# Patient Record
Sex: Female | Born: 1985 | Race: White | Hispanic: No | Marital: Married | Smoking: Never smoker
Health system: Southern US, Community
[De-identification: ages and names within clinical notes are randomized; demographics above are authoritative.]

## PROBLEM LIST (undated history)

## (undated) DIAGNOSIS — J45909 Unspecified asthma, uncomplicated: Secondary | ICD-10-CM

## (undated) DIAGNOSIS — S82899A Other fracture of unspecified lower leg, initial encounter for closed fracture: Secondary | ICD-10-CM

## (undated) DIAGNOSIS — Z8709 Personal history of other diseases of the respiratory system: Secondary | ICD-10-CM

## (undated) DIAGNOSIS — Z87898 Personal history of other specified conditions: Secondary | ICD-10-CM

## (undated) DIAGNOSIS — F419 Anxiety disorder, unspecified: Secondary | ICD-10-CM

## (undated) DIAGNOSIS — F319 Bipolar disorder, unspecified: Secondary | ICD-10-CM

## (undated) DIAGNOSIS — E669 Obesity, unspecified: Secondary | ICD-10-CM

## (undated) DIAGNOSIS — G43909 Migraine, unspecified, not intractable, without status migrainosus: Secondary | ICD-10-CM

## (undated) HISTORY — PX: BUNIONECTOMY: SHX129

## (undated) HISTORY — PX: ADENOIDECTOMY: SUR15

---
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
## 2008-12-08 ENCOUNTER — Emergency Department (HOSPITAL_COMMUNITY): Admission: EM | Admit: 2008-12-08 | Discharge: 2008-12-08 | Payer: Self-pay | Admitting: Emergency Medicine

## 2009-11-12 ENCOUNTER — Emergency Department (HOSPITAL_COMMUNITY): Admission: EM | Admit: 2009-11-12 | Discharge: 2009-11-12 | Payer: Self-pay | Admitting: Emergency Medicine

## 2010-12-31 LAB — CBC
HCT: 36.6 % (ref 36.0–46.0)
Hemoglobin: 12.4 g/dL (ref 12.0–15.0)
MCHC: 34 g/dL (ref 30.0–36.0)
MCV: 86.5 fL (ref 78.0–100.0)
Platelets: 286 10*3/uL (ref 150–400)
RBC: 4.23 MIL/uL (ref 3.87–5.11)
RDW: 13.6 % (ref 11.5–15.5)
WBC: 10 10*3/uL (ref 4.0–10.5)

## 2010-12-31 LAB — URINALYSIS, ROUTINE W REFLEX MICROSCOPIC
Bilirubin Urine: NEGATIVE
Glucose, UA: NEGATIVE mg/dL
Ketones, ur: NEGATIVE mg/dL
Leukocytes, UA: NEGATIVE
Nitrite: NEGATIVE
Protein, ur: NEGATIVE mg/dL
Specific Gravity, Urine: <1.005 — ABNORMAL LOW (ref 1.005–1.030)
Urobilinogen, UA: 0.2 mg/dL (ref 0.0–1.0)
pH: 5.5 (ref 5.0–8.0)

## 2010-12-31 LAB — COMPREHENSIVE METABOLIC PANEL
ALT: 15 U/L (ref 0–35)
AST: 18 U/L (ref 0–37)
Albumin: 3.5 g/dL (ref 3.5–5.2)
Alkaline Phosphatase: 71 U/L (ref 39–117)
BUN: 12 mg/dL (ref 6–23)
CO2: 26 mEq/L (ref 19–32)
Calcium: 8.5 mg/dL (ref 8.4–10.5)
Chloride: 105 mEq/L (ref 96–112)
Creatinine, Ser: 1.91 mg/dL — ABNORMAL HIGH (ref 0.4–1.2)
GFR calc Af Amer: 39 mL/min — ABNORMAL LOW (ref >60)
GFR calc non Af Amer: 33 mL/min — ABNORMAL LOW (ref >60)
Glucose, Bld: 99 mg/dL (ref 70–99)
Potassium: 3.2 mEq/L — ABNORMAL LOW (ref 3.5–5.1)
Sodium: 138 mEq/L (ref 135–145)
Total Bilirubin: 0.5 mg/dL (ref 0.3–1.2)
Total Protein: 7 g/dL (ref 6.0–8.3)

## 2010-12-31 LAB — PREGNANCY, URINE: Preg Test, Ur: NEGATIVE

## 2010-12-31 LAB — DIFFERENTIAL
Basophils Absolute: 0 10*3/uL (ref 0.0–0.1)
Basophils Relative: 0 % (ref 0–1)
Eosinophils Absolute: 0.1 10*3/uL (ref 0.0–0.7)
Eosinophils Relative: 1 % (ref 0–5)
Lymphocytes Relative: 20 % (ref 12–46)
Lymphs Abs: 2 10*3/uL (ref 0.7–4.0)
Monocytes Absolute: 0.5 10*3/uL (ref 0.1–1.0)
Monocytes Relative: 6 % (ref 3–12)
Neutro Abs: 7.3 10*3/uL (ref 1.7–7.7)
Neutrophils Relative %: 73 % (ref 43–77)

## 2010-12-31 LAB — URINE MICROSCOPIC-ADD ON

## 2010-12-31 LAB — LIPASE, BLOOD: Lipase: 13 U/L (ref 11–59)

## 2011-03-02 NOTE — Procedures (Signed)
CLINICAL HISTORY:  [age]-year-old girl with previous history of seizures  who has been on anticonvulsants.  Medication listed Topamax.   This is a 17-channel EEG recorded during wakeful and drowsy states using  standard 10-20 electrode placement.   Background awake rhythm consists of 9-10 Hz __________ of moderate  amplitude, synchronous, reactive to eye opening and closure.  Intermittent  sharp waves are seen bilaterally in the central and temporal head regions  left more than right.  These acquire spike and wave configuration at times  and look frankly epileptiform in nature lasting for not more than 1 second.  These emanate mainly from the C3 and T3 head regions.  Sleep changes are not  achieved in this tracing except for drowsiness which is significant for  prominence and __________ temporal sharp waves bilaterally left more than  right.  Hyperventilation reveals no significant abnormalities.  Photic  stimulation is unremarkable.  Length of the tracing was 20.9 minutes.  Technical component is optimal.   EKG tracing reveals regular sinus rhythm.   IMPRESSION:  This EEG is __________ abnormal due to presence of focal left  hemispheric cortical irritability.      AOZ:HYQM  D:  10/24/2004 18:17:40  T:  10/24/2004 19:38:39  Job #:  [id number]

## 2012-05-04 ENCOUNTER — Emergency Department (HOSPITAL_COMMUNITY)
Admission: EM | Admit: 2012-05-04 | Discharge: 2012-05-04 | Disposition: A | Discharge status: Home / Self Care | Payer: Self-pay | Attending: Emergency Medicine | Admitting: Emergency Medicine

## 2012-05-04 ENCOUNTER — Encounter (HOSPITAL_COMMUNITY): Payer: Self-pay

## 2012-05-04 DIAGNOSIS — N760 Acute vaginitis: Secondary | ICD-10-CM | POA: Insufficient documentation

## 2012-05-04 DIAGNOSIS — R102 Pelvic and perineal pain: Secondary | ICD-10-CM

## 2012-05-04 DIAGNOSIS — A499 Bacterial infection, unspecified: Secondary | ICD-10-CM | POA: Insufficient documentation

## 2012-05-04 DIAGNOSIS — G40909 Epilepsy, unspecified, not intractable, without status epilepticus: Secondary | ICD-10-CM | POA: Insufficient documentation

## 2012-05-04 DIAGNOSIS — B9689 Other specified bacterial agents as the cause of diseases classified elsewhere: Secondary | ICD-10-CM | POA: Insufficient documentation

## 2012-05-04 LAB — URINALYSIS, ROUTINE W REFLEX MICROSCOPIC
Bilirubin Urine: NEGATIVE
Glucose, UA: NEGATIVE mg/dL
Hgb urine dipstick: NEGATIVE
Ketones, ur: NEGATIVE mg/dL
Leukocytes, UA: NEGATIVE
Nitrite: NEGATIVE
Protein, ur: NEGATIVE mg/dL
Specific Gravity, Urine: 1.015 (ref 1.005–1.030)
Urobilinogen, UA: 0.2 mg/dL (ref 0.0–1.0)
pH: 7 (ref 5.0–8.0)

## 2012-05-04 LAB — WET PREP, GENITAL
Trich, Wet Prep: NONE SEEN
Yeast Wet Prep HPF POC: NONE SEEN

## 2012-05-04 LAB — PREGNANCY, URINE: Preg Test, Ur: NEGATIVE

## 2012-05-04 MED ORDER — HYDROCODONE-ACETAMINOPHEN 5-325 MG PO TABS
1.0000 | ORAL_TABLET | Freq: Once | ORAL | Status: AC
  Administered 2012-05-04: 1 via ORAL
  Filled 2012-05-04: qty 1

## 2012-05-04 MED ORDER — HYDROCODONE-ACETAMINOPHEN 5-325 MG PO TABS: 1.0000 | ORAL_TABLET | ORAL | Status: DC | PRN

## 2012-05-04 MED ORDER — METRONIDAZOLE 500 MG PO TABS: 500.0000 mg | ORAL_TABLET | Freq: Two times a day (BID) | ORAL | Status: DC

## 2012-05-04 MED ORDER — HYDROCODONE-ACETAMINOPHEN 5-325 MG PO TABS
1.0000 | ORAL_TABLET | ORAL | Status: DC | PRN
Start: 2012-05-04 — End: 2012-05-04

## 2012-05-04 MED ORDER — METRONIDAZOLE 500 MG PO TABS: 500.0000 mg | ORAL_TABLET | Freq: Two times a day (BID) | ORAL | Status: AC

## 2012-05-04 MED ORDER — HYDROCODONE-ACETAMINOPHEN 5-325 MG PO TABS: 1.0000 | ORAL_TABLET | ORAL | Status: AC | PRN

## 2012-05-04 NOTE — ED Notes (Signed)
Complain of pelvic pain after sex

## 2012-05-04 NOTE — ED Notes (Signed)
Aware of [place] appt at 8am tomorrow. Be here at 745 with full bladder. Advised to drink one bottle of water prior to appt. Pt verbalized understanding.

## 2012-05-05 ENCOUNTER — Other Ambulatory Visit (HOSPITAL_COMMUNITY): Payer: Self-pay | Admitting: Emergency Medicine

## 2012-05-05 ENCOUNTER — Ambulatory Visit (HOSPITAL_COMMUNITY)
Admit: 2012-05-05 | Discharge: 2012-05-05 | Disposition: A | Discharge status: Home / Self Care | Payer: Self-pay | Source: Ambulatory Visit | Attending: Emergency Medicine | Admitting: Emergency Medicine

## 2012-05-05 DIAGNOSIS — N83209 Unspecified ovarian cyst, unspecified side: Secondary | ICD-10-CM | POA: Insufficient documentation

## 2012-05-05 DIAGNOSIS — N949 Unspecified condition associated with female genital organs and menstrual cycle: Secondary | ICD-10-CM | POA: Insufficient documentation

## 2012-05-05 DIAGNOSIS — R102 Pelvic and perineal pain: Secondary | ICD-10-CM

## 2012-05-05 NOTE — ED Provider Notes (Signed)
History     CSN: [id number]  Arrival date & time 05/04/12  1301   First MD Initiated Contact with Patient 05/04/12 1322      Chief Complaint  Patient presents with  . Pelvic Pain    (Consider location/radiation/quality/duration/timing/severity/associated sxs/prior treatment) HPI Comments: Cheyenne Wolfe presents with sharp, stabbing vaginal pain which she experienced briefly one week ago, again last night after having sex, and intermittently throughout today.  Movement, especially sitting and standing seems to trigger these fleeting episodes.  There is no radiation of pain.  She does report vaginal discharge without vaginal irritation or itching and has been present for "along time".  She was placed on Mirena IUD one year ago.  She has had no fevers, chills,  Urinary symptoms or change in bowel habits.  The history is provided by the patient.    Past Medical History  Diagnosis Date  . Epilepsy     History reviewed. No pertinent past surgical history.  No family history on file.  History  Substance Use Topics  . Smoking status: Not on file  . Smokeless tobacco: Not on file  . Alcohol Use: No    OB History    Grav Para Term Preterm Abortions TAB SAB Ect Mult Living                  Review of Systems  Constitutional: Negative for fever.  HENT: Negative for congestion, sore throat and neck pain.   Eyes: Negative.   Respiratory: Negative for chest tightness and shortness of breath.   Cardiovascular: Negative for chest pain.  Gastrointestinal: Negative for nausea and abdominal pain.  Genitourinary: Positive for vaginal discharge and vaginal pain. Negative for vaginal bleeding.  Musculoskeletal: Negative for joint swelling and arthralgias.  Skin: Negative.  Negative for rash and wound.  Neurological: Negative for dizziness, weakness, light-headedness, numbness and headaches.  Hematological: Negative.   Psychiatric/Behavioral: Negative.     Allergies  Review of  patient's allergies indicates no known allergies.  Home Medications   Current Outpatient Rx  Name Route Sig Dispense Refill  . HYDROCODONE-ACETAMINOPHEN 5-325 MG PO TABS Oral Take 1 tablet by mouth every 4 (four) hours as needed for pain. 20 tablet 0  . METRONIDAZOLE 500 MG PO TABS Oral Take 1 tablet (500 mg total) by mouth 2 (two) times daily. 14 tablet 0    BP 102/62  Pulse 64  Temp 97.9 F (36.6 C) (Oral)  Resp 18  Ht 5\' 3"  (1.6 m)  Wt 273 lb (123.832 kg)  BMI 48.36 kg/m2  SpO2 100%  Physical Exam  Nursing note and vitals reviewed. Constitutional: She appears well-developed and well-nourished.  HENT:  Head: Normocephalic and atraumatic.  Eyes: Conjunctivae are normal.  Neck: Normal range of motion.  Cardiovascular: Normal rate, regular rhythm, normal heart sounds and intact distal pulses.   Pulmonary/Chest: Effort normal and breath sounds normal. She has no wheezes.  Abdominal: Soft. Bowel sounds are normal. There is no tenderness.  Genitourinary: Uterus is tender. Cervix exhibits no motion tenderness, no discharge and no friability. Right adnexum displays tenderness. Left adnexum displays no tenderness. No tenderness around the vagina. No foreign body around the vagina. Vaginal discharge found.       IUD string visualized.  White watery discharge present.  Identifying uterus and adnexa difficult secondary to body habitus.  Musculoskeletal: Normal range of motion.  Neurological: She is alert.  Skin: Skin is warm and dry.  Psychiatric: She has a normal mood and  affect.    ED Course  Procedures (including critical care time)  Labs Reviewed  WET PREP, GENITAL - Abnormal; Notable for the following:    Clue Cells Wet Prep HPF POC MODERATE (*)     WBC, Wet Prep HPF POC FEW (*)     All other components within normal limits  URINALYSIS, ROUTINE W REFLEX MICROSCOPIC  PREGNANCY, URINE  GC/CHLAMYDIA PROBE AMP, GENITAL   No results found.   1. Pelvic pain in female   2.  Bacterial vaginosis       MDM  Pt prescribed flagyl,  Hydrocodone.  [place] scheduled for tomorrow to assess for ovarian cyst/make sure Mirena is in proper position.        Burgess Amor, PA 05/05/12 0819  Burgess Amor, PA 05/05/12 [phone]

## 2012-05-05 NOTE — ED Provider Notes (Signed)
Medical screening examination/treatment/procedure(s) were performed by non-physician practitioner and as supervising physician I was immediately available for consultation/collaboration.   Laray Anger, DO 05/05/12 1533

## 2012-05-06 LAB — GC/CHLAMYDIA PROBE AMP, GENITAL
Chlamydia, DNA Probe: POSITIVE — AB
GC Probe Amp, Genital: NEGATIVE

## 2012-05-09 NOTE — ED Notes (Signed)
+  Chlamydia. Chart sent to EDP office for review. Chart returned from EDP office. Prescribed Zithromax 250 mg tabs, #4. 4 tabs PO once. Prescribed by Grant Fontana PA-C. Attempted to contact patient. Phone number is no longer in service. Sent letter.

## 2012-06-09 NOTE — ED Notes (Signed)
No response after 30 days. Chart appended and sent to Medical Records. 

## 2013-04-11 ENCOUNTER — Emergency Department (HOSPITAL_COMMUNITY)
Admission: EM | Admit: 2013-04-11 | Discharge: 2013-04-11 | Disposition: A | Discharge status: Home / Self Care | Payer: Medicaid Other | Attending: Emergency Medicine | Admitting: Emergency Medicine

## 2013-04-11 ENCOUNTER — Encounter (HOSPITAL_COMMUNITY): Payer: Self-pay | Admitting: *Deleted

## 2013-04-11 DIAGNOSIS — L02419 Cutaneous abscess of limb, unspecified: Secondary | ICD-10-CM | POA: Insufficient documentation

## 2013-04-11 DIAGNOSIS — L03119 Cellulitis of unspecified part of limb: Secondary | ICD-10-CM

## 2013-04-11 DIAGNOSIS — Z79899 Other long term (current) drug therapy: Secondary | ICD-10-CM | POA: Insufficient documentation

## 2013-04-11 DIAGNOSIS — G40909 Epilepsy, unspecified, not intractable, without status epilepticus: Secondary | ICD-10-CM | POA: Insufficient documentation

## 2013-04-11 MED ORDER — HYDROCODONE-ACETAMINOPHEN 5-325 MG PO TABS: 1.0000 | ORAL_TABLET | ORAL | Status: DC | PRN

## 2013-04-11 NOTE — ED Notes (Signed)
Pt c/o ?inscest bite to inner right thigh area that she noticed a few days ago, was draining pus after pt "cut" it at home. Still continues to have pain, redness, swelling to area.

## 2013-04-11 NOTE — ED Provider Notes (Signed)
History    CSN: [id number] Arrival date & time 04/11/13  1831  First MD Initiated Contact with Patient 04/11/13 1840     Chief Complaint  Patient presents with  . Insect Bite   (Consider location/radiation/quality/duration/timing/severity/associated sxs/prior Treatment) HPI Comments: Cheyenne Wolfe is a 27 y.o. Female presenting with an abscess to her right inner thigh which has been present for the past week.  She was seen at [facility] 4 days ago at which time she was placed on bactrim and given tramadol for pain which has not improved her pain symptom.  However,  The abscess site has drained and the redness has decreased in size and intensity over the past 24 hours.  She was surprised that the area was not opened up at her ed visit this week.  She describes cutting off the scab 2 days ago and obtaining a small amount of pus.  She denies fevers , chills, nausea or flu like symptoms.  She reports having a history of frequent skin infections mostly in the groin and axilla,  Almost weekly since she was 27 years old.     The history is provided by the patient.   Past Medical History  Diagnosis Date  . Epilepsy    History reviewed. No pertinent past surgical history. No family history on file. History  Substance Use Topics  . Smoking status: Not on file  . Smokeless tobacco: Not on file  . Alcohol Use: No   OB History   Grav Para Term Preterm Abortions TAB SAB Ect Mult Living                 Review of Systems  Constitutional: Negative for fever and chills.  HENT: Negative for facial swelling.   Respiratory: Negative for shortness of breath and wheezing.   Skin: Positive for color change and wound.  Neurological: Negative for numbness.    Allergies  Review of patient's allergies indicates no known allergies.  Home Medications   Current Outpatient Rx  Name  Route  Sig  Dispense  Refill  . acetaminophen (TYLENOL) 500 MG tablet   Oral   Take 500 mg by mouth  every 6 (six) hours as needed for pain.         Marland Kitchen sulfamethoxazole-trimethoprim (BACTRIM DS) 800-160 MG per tablet   Oral   Take 1 tablet by mouth 2 (two) times daily. For 10 days         . topiramate (TOPAMAX) 100 MG tablet   Oral   Take 100 mg by mouth 2 (two) times daily.         Marland Kitchen HYDROcodone-acetaminophen (NORCO/VICODIN) 5-325 MG per tablet   Oral   Take 1 tablet by mouth every 4 (four) hours as needed for pain.   15 tablet   0    BP 114/80  Pulse 80  Temp(Src) 98.2 F (36.8 C) (Oral)  Resp 18  SpO2 100% Physical Exam  Constitutional: She appears well-developed and well-nourished. No distress.  HENT:  Head: Normocephalic.  Neck: Neck supple.  Cardiovascular: Normal rate.   Pulmonary/Chest: Effort normal. She has no wheezes.  Musculoskeletal: Normal range of motion. She exhibits no edema.  Skin: There is erythema.  1 cm indurated scabbed lesion right medial mid thigh.  6 cm faint surrounding cellulitis.  No drainage,  No fluctuance.    ED Course  Procedures (including critical care time) Labs Reviewed - No data to display No results found. 1. Cellulitis and abscess of  leg     MDM  Per patient report,  Lesion is smaller and red area smaller and less intense.  There is no palpable pus pocket, no active drainage.  Encouraged to complete abx.  Warm soaks,  Prescribed hydrocodone in place of tramadol.  F/u with pcp this week for recheck or return here if this does not continue to get smaller as it appears to be responding to the abx at this time.  Burgess Amor, PA-C 04/11/13 1956

## 2013-04-13 NOTE — ED Provider Notes (Signed)
Medical screening examination/treatment/procedure(s) were performed by non-physician practitioner and as supervising physician I was immediately available for consultation/collaboration.  Astella Desir L Kaan Tosh, MD 04/13/13 1652 

## 2013-07-31 ENCOUNTER — Emergency Department (HOSPITAL_COMMUNITY)
Admission: EM | Admit: 2013-07-31 | Discharge: 2013-07-31 | Disposition: A | Discharge status: Home / Self Care | Payer: Medicaid Other | Attending: Emergency Medicine | Admitting: Emergency Medicine

## 2013-07-31 ENCOUNTER — Encounter (HOSPITAL_COMMUNITY): Payer: Self-pay | Admitting: Emergency Medicine

## 2013-07-31 DIAGNOSIS — H6690 Otitis media, unspecified, unspecified ear: Secondary | ICD-10-CM | POA: Insufficient documentation

## 2013-07-31 DIAGNOSIS — G40909 Epilepsy, unspecified, not intractable, without status epilepticus: Secondary | ICD-10-CM | POA: Insufficient documentation

## 2013-07-31 DIAGNOSIS — J069 Acute upper respiratory infection, unspecified: Secondary | ICD-10-CM | POA: Insufficient documentation

## 2013-07-31 DIAGNOSIS — J029 Acute pharyngitis, unspecified: Secondary | ICD-10-CM | POA: Insufficient documentation

## 2013-07-31 DIAGNOSIS — Z79899 Other long term (current) drug therapy: Secondary | ICD-10-CM | POA: Insufficient documentation

## 2013-07-31 DIAGNOSIS — H6692 Otitis media, unspecified, left ear: Secondary | ICD-10-CM

## 2013-07-31 MED ORDER — LORATADINE-PSEUDOEPHEDRINE ER 5-120 MG PO TB12: 1.0000 | ORAL_TABLET | Freq: Two times a day (BID) | ORAL | Status: DC

## 2013-07-31 MED ORDER — AMOXICILLIN 500 MG PO CAPS: 500.0000 mg | ORAL_CAPSULE | Freq: Three times a day (TID) | ORAL | Status: AC

## 2013-07-31 MED ORDER — AMOXICILLIN 250 MG PO CAPS
500.0000 mg | ORAL_CAPSULE | Freq: Once | ORAL | Status: AC
  Administered 2013-07-31: 500 mg via ORAL
  Filled 2013-07-31: qty 2

## 2013-07-31 NOTE — ED Notes (Signed)
Pt c/o cough x 4 days, otalgia 2 days ago and sore throat today. No fever.

## 2013-07-31 NOTE — ED Provider Notes (Signed)
  Medical screening examination/treatment/procedure(s) were performed by non-physician practitioner and as supervising physician I was immediately available for consultation/collaboration.    Kole Hilyard, MD 07/31/13 2039 

## 2013-07-31 NOTE — ED Provider Notes (Signed)
CSN: [id number]     Arrival date & time 07/31/13  1916 History   First MD Initiated Contact with Patient 07/31/13 1927     Chief Complaint  Patient presents with  . Otalgia  . Sore Throat  . Cough   (Consider location/radiation/quality/duration/timing/severity/associated sxs/prior Treatment) HPI Comments: Shannon Balthazar Nanni is a 27 y.o. Female 4 day history of uri type symptoms which includes nasal congestion with clear rhinorrhea, sore throat and nonproductive cough.  Over the past 2 days she has also developed left sharp stabbing pain in her left ear.  .  Symptoms due to not include shortness of breath, chest pain,  Nausea, vomiting or diarrhea.  The patient has taken tylenol prior to arrival with no significant improvement in symptoms.      The history is provided by the patient.    Past Medical History  Diagnosis Date  . Epilepsy    Past Surgical History  Procedure Laterality Date  . Bunionectomy     Family History  Problem Relation Age of Onset  . Hypertension Mother   . Diabetes Mother   . Schizophrenia Father   . Asthma Brother   . Hypertension Other   . Cancer Other    History  Substance Use Topics  . Smoking status: Never Smoker   . Smokeless tobacco: Never Used  . Alcohol Use: No   OB History   Grav Para Term Preterm Abortions TAB SAB Ect Mult Living   2 2 1 1            Review of Systems  Constitutional: Negative for fever and chills.  HENT: Positive for congestion, ear pain, postnasal drip, rhinorrhea and sore throat. Negative for ear discharge, hearing loss, trouble swallowing and voice change.   Eyes: Negative for discharge.  Respiratory: Positive for cough. Negative for shortness of breath, wheezing and stridor.   Cardiovascular: Negative for chest pain.  Gastrointestinal: Negative for abdominal pain.  Genitourinary: Negative.     Allergies  Review of patient's allergies indicates no known allergies.  Home Medications   Current Outpatient Rx   Name  Route  Sig  Dispense  Refill  . acetaminophen (TYLENOL) 500 MG tablet   Oral   Take 500 mg by mouth every 6 (six) hours as needed for pain.         Marland Kitchen topiramate (TOPAMAX) 100 MG tablet   Oral   Take 100 mg by mouth 2 (two) times daily.         Marland Kitchen amoxicillin (AMOXIL) 500 MG capsule   Oral   Take 1 capsule (500 mg total) by mouth 3 (three) times daily.   30 capsule   0   . loratadine-pseudoephedrine (CLARITIN-D 12 HOUR) 5-120 MG per tablet   Oral   Take 1 tablet by mouth 2 (two) times daily.   14 tablet   0    BP 124/74  Pulse 85  Resp 16  Ht 5\' 3"  (1.6 m)  Wt 250 lb (113.399 kg)  BMI 44.3 kg/m2  SpO2 100% Physical Exam  Constitutional: She is oriented to person, place, and time. She appears well-developed and well-nourished.  HENT:  Head: Normocephalic and atraumatic.  Right Ear: Tympanic membrane, external ear and ear canal normal.  Left Ear: External ear and ear canal normal. No mastoid tenderness. Tympanic membrane is injected and erythematous. Tympanic membrane is not bulging.  Nose: Mucosal edema and rhinorrhea present.  Mouth/Throat: Uvula is midline, oropharynx is clear and moist and mucous membranes are  normal. No oropharyngeal exudate, posterior oropharyngeal edema, posterior oropharyngeal erythema or tonsillar abscesses.  Scarring bilateral TM's (history of tympanostomy tubes).  Eyes: Conjunctivae are normal.  Cardiovascular: Normal rate and normal heart sounds.   Pulmonary/Chest: Effort normal. No respiratory distress. She has no wheezes. She has no rales.  Abdominal: Soft. There is no tenderness.  Musculoskeletal: Normal range of motion.  Neurological: She is alert and oriented to person, place, and time.  Skin: Skin is warm and dry. No rash noted.  Psychiatric: She has a normal mood and affect.    ED Course  Procedures (including critical care time) Labs Review Labs Reviewed - No data to display Imaging Review No results found.  EKG  Interpretation   None       MDM   1. Otitis media, left   2. URI (upper respiratory infection)    Patient was prescribed amoxicillin with first dose given here.  Also prescribed Claritin-D for twice a day use.  Encouraged rest, increased fluids, Motrin if needed for pain relief.  Follow up with PCP if not improving or if symptoms worsen in any way.    Burgess Amor, PA-C 07/31/13 2005

## 2013-10-15 ENCOUNTER — Encounter (HOSPITAL_COMMUNITY): Payer: Self-pay | Admitting: Emergency Medicine

## 2013-10-15 ENCOUNTER — Emergency Department (HOSPITAL_COMMUNITY)
Admission: EM | Admit: 2013-10-15 | Discharge: 2013-10-15 | Disposition: A | Discharge status: Home / Self Care | Payer: Medicaid Other | Attending: Emergency Medicine | Admitting: Emergency Medicine

## 2013-10-15 DIAGNOSIS — R69 Illness, unspecified: Secondary | ICD-10-CM

## 2013-10-15 DIAGNOSIS — Z8669 Personal history of other diseases of the nervous system and sense organs: Secondary | ICD-10-CM | POA: Insufficient documentation

## 2013-10-15 DIAGNOSIS — J111 Influenza due to unidentified influenza virus with other respiratory manifestations: Secondary | ICD-10-CM | POA: Insufficient documentation

## 2013-10-15 DIAGNOSIS — Z79899 Other long term (current) drug therapy: Secondary | ICD-10-CM | POA: Insufficient documentation

## 2013-10-15 MED ORDER — OSELTAMIVIR PHOSPHATE 75 MG PO CAPS: 75.0000 mg | ORAL_CAPSULE | Freq: Two times a day (BID) | ORAL | Status: DC

## 2013-10-15 NOTE — Discharge Instructions (Signed)
Influenza, Adult °Influenza (flu) is an infection in the mouth, nose, and throat (respiratory tract) caused by a virus. The flu can make you feel very ill. Influenza spreads easily from person to person (contagious).  °HOME CARE  °· Only take medicines as told by your doctor. °· Use a cool mist humidifier to make breathing easier. °· Get plenty of rest until your fever goes away. This usually takes 3 to 4 days. °· Drink enough fluids to keep your pee (urine) clear or pale yellow. °· Cover your mouth and nose when you cough or sneeze. °· Wash your hands well to avoid spreading the flu. °· Stay home from work or school until your fever has been gone for at least 1 full day. °· Get a flu shot every year. °GET HELP RIGHT AWAY IF:  °· You have trouble breathing or feel short of breath. °· Your skin or nails turn blue. °· You have severe neck pain or stiffness. °· You have a severe headache, facial pain, or earache. °· Your fever gets worse or keeps coming back. °· You feel sick to your stomach (nauseous), throw up (vomit), or have watery poop (diarrhea). °· You have chest pain. °· You have a deep cough that gets worse, or you cough up more thick spit (mucus). °MAKE SURE YOU:  °· Understand these instructions. °· Will watch your condition. °· Will get help right away if you are not doing well or get worse. °Document Released: 07/10/2008 Document Revised: 04/01/2012 Document Reviewed: 12/31/2011 °Exit[employer]® Patient Information ©2014 [employer]. ° °

## 2013-10-15 NOTE — ED Notes (Signed)
Patient complaining of cough and shortness of breath starting yesterday.

## 2013-10-15 NOTE — ED Provider Notes (Signed)
CSN: [id number]     Arrival date & time 10/15/13  1548 History  This chart was scribed for Hilario Quarryanielle S Zayon Trulson, MD by Quintella ReichertMatthew Underwood, ED scribe.  This patient was seen in room [facility] and the patient's care was started at 5:04 PM.   Chief Complaint  Patient presents with  . Cough    Patient is a 28 y.o. female presenting with cough. The history is provided by the patient. No language interpreter was used.  Cough Severity:  Moderate Duration:  1 day Timing: persistent. Progression:  Worsening Chronicity:  New Smoker: no   Associated symptoms: chills, myalgias, rhinorrhea and sore throat   Associated symptoms: no fever     HPI Comments: Cheyenne Wolfe is a 28 y.o. female who presents to the Emergency Department complaining of persistent, worsening cough that began yesterday with associated rhinorrhea, sore throat, generalized body aches, and chills.  Pt denies fever, nausea, vomiting, or diarrhea.  She has been taking Tylenol.  She did not receive a flu shot this year.  She medicates regularly with Topamax for seizures.  She denies other chronic medical conditions or regular medication usage.  She denies medication allergies.  She does not smoke or drink alcohol.  She denies possibility of pregnancy.   Past Medical History  Diagnosis Date  . Epilepsy     Past Surgical History  Procedure Laterality Date  . Bunionectomy      Family History  Problem Relation Age of Onset  . Hypertension Mother   . Diabetes Mother   . Schizophrenia Father   . Asthma Brother   . Hypertension Other   . Cancer Other    History  Substance Use Topics  . Smoking status: Never Smoker   . Smokeless tobacco: Never Used  . Alcohol Use: No    OB History   Grav Para Term Preterm Abortions TAB SAB Ect Mult Living   2 2 1 1             Review of Systems  Constitutional: Positive for chills. Negative for fever.  HENT: Positive for rhinorrhea and sore throat.   Respiratory: Positive for cough.    Gastrointestinal: Negative for nausea, vomiting and diarrhea.  Musculoskeletal: Positive for myalgias.  All other systems reviewed and are negative.     Allergies  Review of patient's allergies indicates no known allergies.  Home Medications   Current Outpatient Rx  Name  Route  Sig  Dispense  Refill  . acetaminophen (TYLENOL) 500 MG tablet   Oral   Take 500 mg by mouth every 6 (six) hours as needed for pain.         Marland Kitchen. loratadine-pseudoephedrine (CLARITIN-D 12 HOUR) 5-120 MG per tablet   Oral   Take 1 tablet by mouth 2 (two) times daily.   14 tablet   0   . topiramate (TOPAMAX) 100 MG tablet   Oral   Take 100 mg by mouth 2 (two) times daily.          BP 120/68  Pulse 96  Temp(Src) 98.2 F (36.8 C) (Oral)  Resp 18  Ht 5\' 3"  (1.6 m)  Wt 255 lb (115.667 kg)  BMI 45.18 kg/m2  SpO2 100%  Physical Exam  Nursing note and vitals reviewed. Constitutional: She is oriented to person, place, and time. She appears well-developed and well-nourished. No distress.  HENT:  Head: Normocephalic and atraumatic.  Eyes: EOM are normal.  Neck: Neck supple. No tracheal deviation present.  Cardiovascular: Normal rate.  Pulmonary/Chest: Effort normal and breath sounds normal. No respiratory distress. She has no wheezes. She has no rales.  Coughing on exam  Musculoskeletal: Normal range of motion.  Neurological: She is alert and oriented to person, place, and time.  Skin: Skin is warm and dry.  Psychiatric: She has a normal mood and affect. Her behavior is normal.    ED Course  Procedures (including critical care time)  DIAGNOSTIC STUDIES: Oxygen Saturation is 100% on room air, normal by my interpretation.    COORDINATION OF CARE: 5:08 PM-Informed pt that symptoms are likely due to flu.  Discussed treatment plan which includes symptomatic relief with pt at bedside and pt agreed to plan.    Labs Review Labs Reviewed - No data to display  Imaging Review No results  found.  EKG Interpretation   None       MDM  No diagnosis found. Influenza like illness  I personally performed the services described in this documentation, which was scribed in my presence. The recorded information has been reviewed and considered.    Hilario Quarry, MD 10/20/13 [phone]

## 2014-08-16 ENCOUNTER — Encounter (HOSPITAL_COMMUNITY): Payer: Self-pay | Admitting: Emergency Medicine

## 2015-01-25 ENCOUNTER — Other Ambulatory Visit (HOSPITAL_COMMUNITY)
Admission: RE | Admit: 2015-01-25 | Discharge: 2015-01-25 | Disposition: A | Discharge status: Home / Self Care | Payer: Medicaid Other | Source: Ambulatory Visit | Attending: Unknown Physician Specialty | Admitting: Unknown Physician Specialty

## 2015-01-25 DIAGNOSIS — R87619 Unspecified abnormal cytological findings in specimens from cervix uteri: Secondary | ICD-10-CM | POA: Insufficient documentation

## 2015-01-25 DIAGNOSIS — R87612 Low grade squamous intraepithelial lesion on cytologic smear of cervix (LGSIL): Secondary | ICD-10-CM | POA: Insufficient documentation

## 2015-01-28 LAB — CYTOLOGY - PAP

## 2015-11-02 ENCOUNTER — Encounter: Payer: Self-pay | Admitting: Adult Health

## 2015-11-09 ENCOUNTER — Ambulatory Visit: Payer: Self-pay | Admitting: Adult Health

## 2015-11-09 ENCOUNTER — Encounter: Payer: Self-pay | Admitting: Adult Health

## 2016-01-08 ENCOUNTER — Emergency Department (HOSPITAL_COMMUNITY): Payer: No Typology Code available for payment source

## 2016-01-08 ENCOUNTER — Encounter (HOSPITAL_COMMUNITY): Payer: Self-pay | Admitting: *Deleted

## 2016-01-08 ENCOUNTER — Emergency Department (HOSPITAL_COMMUNITY)
Admission: EM | Admit: 2016-01-08 | Discharge: 2016-01-09 | Disposition: A | Discharge status: Home / Self Care | Payer: No Typology Code available for payment source | Attending: Emergency Medicine | Admitting: Emergency Medicine

## 2016-01-08 DIAGNOSIS — Z79899 Other long term (current) drug therapy: Secondary | ICD-10-CM | POA: Insufficient documentation

## 2016-01-08 DIAGNOSIS — S8002XA Contusion of left knee, initial encounter: Secondary | ICD-10-CM | POA: Insufficient documentation

## 2016-01-08 DIAGNOSIS — Z3A14 14 weeks gestation of pregnancy: Secondary | ICD-10-CM | POA: Diagnosis not present

## 2016-01-08 DIAGNOSIS — Y939 Activity, unspecified: Secondary | ICD-10-CM | POA: Insufficient documentation

## 2016-01-08 DIAGNOSIS — Y999 Unspecified external cause status: Secondary | ICD-10-CM | POA: Insufficient documentation

## 2016-01-08 DIAGNOSIS — O9A211 Injury, poisoning and certain other consequences of external causes complicating pregnancy, first trimester: Secondary | ICD-10-CM | POA: Diagnosis present

## 2016-01-08 DIAGNOSIS — Y929 Unspecified place or not applicable: Secondary | ICD-10-CM | POA: Diagnosis not present

## 2016-01-08 MED ORDER — ACETAMINOPHEN 325 MG PO TABS
650.0000 mg | ORAL_TABLET | Freq: Once | ORAL | Status: AC
  Administered 2016-01-08: 650 mg via ORAL

## 2016-01-08 MED ORDER — ACETAMINOPHEN 325 MG PO TABS
ORAL_TABLET | ORAL | Status: AC
  Filled 2016-01-08: qty 2
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
## 2016-01-09 NOTE — Discharge Instructions (Signed)

## 2016-04-25 ENCOUNTER — Other Ambulatory Visit (HOSPITAL_COMMUNITY): Payer: Self-pay | Admitting: Obstetrics and Gynecology

## 2016-04-25 ENCOUNTER — Encounter (HOSPITAL_COMMUNITY): Payer: Self-pay | Admitting: Obstetrics and Gynecology

## 2016-05-17 ENCOUNTER — Encounter (HOSPITAL_COMMUNITY): Payer: Self-pay | Admitting: *Deleted

## 2016-05-18 ENCOUNTER — Ambulatory Visit (HOSPITAL_COMMUNITY)
Admission: RE | Admit: 2016-05-18 | Discharge: 2016-05-18 | Disposition: A | Discharge status: Home / Self Care | Payer: Medicaid Other | Source: Ambulatory Visit | Attending: Obstetrics and Gynecology | Admitting: Obstetrics and Gynecology

## 2016-05-18 ENCOUNTER — Encounter (HOSPITAL_COMMUNITY): Payer: Self-pay

## 2016-05-18 ENCOUNTER — Other Ambulatory Visit (HOSPITAL_COMMUNITY): Payer: Self-pay | Admitting: Obstetrics and Gynecology

## 2016-05-18 DIAGNOSIS — O99210 Obesity complicating pregnancy, unspecified trimester: Secondary | ICD-10-CM

## 2016-05-18 DIAGNOSIS — Z36 Encounter for antenatal screening of mother: Secondary | ICD-10-CM | POA: Diagnosis not present

## 2016-05-18 DIAGNOSIS — E669 Obesity, unspecified: Secondary | ICD-10-CM | POA: Insufficient documentation

## 2016-05-18 DIAGNOSIS — O99213 Obesity complicating pregnancy, third trimester: Secondary | ICD-10-CM | POA: Diagnosis not present

## 2016-05-18 DIAGNOSIS — Z3A32 32 weeks gestation of pregnancy: Secondary | ICD-10-CM

## 2016-05-18 DIAGNOSIS — Z1389 Encounter for screening for other disorder: Secondary | ICD-10-CM

## 2016-05-23 ENCOUNTER — Encounter (HOSPITAL_COMMUNITY): Payer: Self-pay

## 2016-05-23 ENCOUNTER — Other Ambulatory Visit (HOSPITAL_COMMUNITY): Payer: Self-pay

## 2016-06-01 ENCOUNTER — Ambulatory Visit (HOSPITAL_COMMUNITY)
Admission: RE | Admit: 2016-06-01 | Discharge: 2016-06-01 | Disposition: A | Discharge status: Home / Self Care | Payer: Medicaid Other | Source: Ambulatory Visit | Attending: Obstetrics and Gynecology | Admitting: Obstetrics and Gynecology

## 2016-06-01 ENCOUNTER — Encounter (HOSPITAL_COMMUNITY): Payer: Self-pay

## 2016-06-01 ENCOUNTER — Other Ambulatory Visit (HOSPITAL_COMMUNITY): Payer: Self-pay | Admitting: Maternal and Fetal Medicine

## 2016-06-01 DIAGNOSIS — Z3A34 34 weeks gestation of pregnancy: Secondary | ICD-10-CM

## 2016-06-01 DIAGNOSIS — O99213 Obesity complicating pregnancy, third trimester: Secondary | ICD-10-CM | POA: Diagnosis not present

## 2016-06-01 DIAGNOSIS — E669 Obesity, unspecified: Secondary | ICD-10-CM | POA: Diagnosis not present

## 2017-03-22 ENCOUNTER — Encounter (HOSPITAL_COMMUNITY): Payer: Self-pay

## 2017-04-05 ENCOUNTER — Ambulatory Visit: Payer: Self-pay | Admitting: Family Medicine

## 2017-04-09 ENCOUNTER — Encounter: Payer: Self-pay | Admitting: Family Medicine

## 2017-04-14 DIAGNOSIS — S82899A Other fracture of unspecified lower leg, initial encounter for closed fracture: Secondary | ICD-10-CM

## 2017-04-14 HISTORY — DX: Other fracture of unspecified lower leg, initial encounter for closed fracture: S82.899A

## 2017-05-06 ENCOUNTER — Ambulatory Visit: Payer: Self-pay | Admitting: Physician Assistant

## 2017-05-06 ENCOUNTER — Encounter (HOSPITAL_BASED_OUTPATIENT_CLINIC_OR_DEPARTMENT_OTHER): Payer: Self-pay | Admitting: *Deleted

## 2017-05-06 NOTE — Pre-Procedure Instructions (Signed)
To come for anesthesia airway evaluation. 

## 2017-05-06 NOTE — H&P (Signed)
Cheyenne Wolfe is an 31 y.o. female.   Chief Complaint: left ankle fracture HPI: patient was seen in outpatient setting for ER follow up left ankle fracture which occurred 05/01/17.  She was xrayed in the [facility] ER showing bimalleolar ankle fracture placed in splint and given crutches.    Past Medical History:  Diagnosis Date  . Ankle fracture 04/2017   left  . History of asthma    no current med.  . History of seizures    no seizures in > 10 yr. - no current med.  . Migraines   . Obesity     Past Surgical History:  Procedure Laterality Date  . BUNIONECTOMY Bilateral     Family History  Problem Relation Age of Onset  . Hypertension Mother   . Diabetes Mother   . Schizophrenia Father   . Asthma Brother   . Hypertension Other   . Cancer Other    Social History:  reports that she has never smoked. She has never used smokeless tobacco. She reports that she does not drink alcohol or use drugs.  Allergies: No Known Allergies   (Not in a hospital admission)  No results found for this or any previous visit (from the past 48 hour(s)). No results found.  Review of Systems  Musculoskeletal: Positive for falls and joint pain.  Neurological: Positive for seizures and headaches.  Psychiatric/Behavioral: Positive for depression.  All other systems reviewed and are negative.   There were no vitals taken for this visit. Physical Exam  Constitutional: She is oriented to person, place, and time. She appears well-developed and well-nourished. No distress.  HENT:  Head: Normocephalic and atraumatic.  Eyes: Pupils are equal, round, and reactive to light. Conjunctivae and EOM are normal.  Neck: Normal range of motion. Neck supple.  Cardiovascular: Normal rate and intact distal pulses.   Respiratory: Effort normal. No respiratory distress.  GI: Soft. She exhibits no distension. There is no tenderness.  Musculoskeletal:       Left ankle: She exhibits decreased range of motion and  swelling. She exhibits no laceration. Tenderness. Lateral malleolus and medial malleolus tenderness found.  Neurological: She is alert and oriented to person, place, and time.  Skin: Skin is warm and dry. No rash noted. No erythema.  Psychiatric: She has a normal mood and affect. Her behavior is normal.     Assessment/Plan Left bimalleolar ankle fracture   Discussed risks and benefits of ORIF left ankle and patient wishes to proceed.  This will be done outpatient with general anesthesia.    Channah Godeaux, PA-C 05/06/2017, 1:15 PM   

## 2017-05-07 NOTE — Progress Notes (Signed)
Pt came in wheelchair for Anesthesia Consult. Pt weighed. Dr.Germeroth and Dr. Krista BlueSinger - ok for surgery. Dr. Krista BlueSinger told pt to take out lip piercing.

## 2017-05-08 ENCOUNTER — Ambulatory Visit (HOSPITAL_BASED_OUTPATIENT_CLINIC_OR_DEPARTMENT_OTHER)
Admission: RE | Admit: 2017-05-08 | Discharge: 2017-05-08 | Disposition: A | Discharge status: Home / Self Care | Payer: Medicaid Other | Source: Ambulatory Visit | Attending: Orthopedic Surgery | Admitting: Orthopedic Surgery

## 2017-05-08 ENCOUNTER — Encounter (HOSPITAL_BASED_OUTPATIENT_CLINIC_OR_DEPARTMENT_OTHER)
Admission: RE | Disposition: A | Discharge status: Home / Self Care | Payer: Self-pay | Source: Ambulatory Visit | Attending: Orthopedic Surgery

## 2017-05-08 ENCOUNTER — Ambulatory Visit (HOSPITAL_BASED_OUTPATIENT_CLINIC_OR_DEPARTMENT_OTHER): Payer: Medicaid Other | Admitting: Anesthesiology

## 2017-05-08 ENCOUNTER — Encounter (HOSPITAL_BASED_OUTPATIENT_CLINIC_OR_DEPARTMENT_OTHER): Payer: Self-pay | Admitting: Anesthesiology

## 2017-05-08 DIAGNOSIS — X58XXXA Exposure to other specified factors, initial encounter: Secondary | ICD-10-CM | POA: Insufficient documentation

## 2017-05-08 DIAGNOSIS — S93432A Sprain of tibiofibular ligament of left ankle, initial encounter: Secondary | ICD-10-CM | POA: Diagnosis not present

## 2017-05-08 DIAGNOSIS — Z6841 Body Mass Index (BMI) 40.0 and over, adult: Secondary | ICD-10-CM | POA: Insufficient documentation

## 2017-05-08 DIAGNOSIS — S82852A Displaced trimalleolar fracture of left lower leg, initial encounter for closed fracture: Secondary | ICD-10-CM | POA: Insufficient documentation

## 2017-05-08 DIAGNOSIS — J45909 Unspecified asthma, uncomplicated: Secondary | ICD-10-CM | POA: Diagnosis not present

## 2017-05-08 HISTORY — DX: Personal history of other diseases of the respiratory system: Z87.09

## 2017-05-08 HISTORY — DX: Obesity, unspecified: E66.9

## 2017-05-08 HISTORY — DX: Personal history of other specified conditions: Z87.898

## 2017-05-08 HISTORY — DX: Other fracture of unspecified lower leg, initial encounter for closed fracture: S82.899A

## 2017-05-08 HISTORY — DX: Migraine, unspecified, not intractable, without status migrainosus: G43.909

## 2017-05-08 HISTORY — PX: ORIF ANKLE FRACTURE: SHX5408

## 2017-05-08 HISTORY — PX: SYNDESMOSIS REPAIR: SHX5182

## 2017-05-08 SURGERY — OPEN REDUCTION INTERNAL FIXATION (ORIF) ANKLE FRACTURE
Anesthesia: General | Site: Ankle | Laterality: Left

## 2017-05-08 MED ORDER — CEFAZOLIN SODIUM-DEXTROSE 2-4 GM/100ML-% IV SOLN
INTRAVENOUS | Status: AC
  Filled 2017-05-08: qty 200

## 2017-05-08 MED ORDER — ONDANSETRON HCL 4 MG/2ML IJ SOLN
INTRAMUSCULAR | Status: AC
  Filled 2017-05-08: qty 2

## 2017-05-08 MED ORDER — MIDAZOLAM HCL 2 MG/2ML IJ SOLN
1.0000 mg | INTRAMUSCULAR | Status: DC | PRN
  Administered 2017-05-08: 2 mg via INTRAVENOUS

## 2017-05-08 MED ORDER — FENTANYL CITRATE (PF) 100 MCG/2ML IJ SOLN
INTRAMUSCULAR | Status: AC
  Filled 2017-05-08: qty 2

## 2017-05-08 MED ORDER — FENTANYL CITRATE (PF) 100 MCG/2ML IJ SOLN
25.0000 ug | INTRAMUSCULAR | Status: DC | PRN
Start: 2017-05-08 — End: 2017-05-08

## 2017-05-08 MED ORDER — MEPERIDINE HCL 25 MG/ML IJ SOLN: 6.2500 mg | INTRAMUSCULAR | Status: DC | PRN

## 2017-05-08 MED ORDER — LACTATED RINGERS IV SOLN
INTRAVENOUS | Status: DC
  Administered 2017-05-08 (×2): via INTRAVENOUS

## 2017-05-08 MED ORDER — PROPOFOL 10 MG/ML IV BOLUS
INTRAVENOUS | Status: AC
  Filled 2017-05-08: qty 20

## 2017-05-08 MED ORDER — CHLORHEXIDINE GLUCONATE 4 % EX LIQD: 60.0000 mL | Freq: Once | CUTANEOUS | Status: DC

## 2017-05-08 MED ORDER — ONDANSETRON HCL 4 MG/2ML IJ SOLN
INTRAMUSCULAR | Status: DC | PRN
  Administered 2017-05-08: 4 mg via INTRAVENOUS

## 2017-05-08 MED ORDER — MIDAZOLAM HCL 2 MG/2ML IJ SOLN
INTRAMUSCULAR | Status: AC
  Filled 2017-05-08: qty 2

## 2017-05-08 MED ORDER — SODIUM CHLORIDE 0.9 % IV SOLN: INTRAVENOUS | Status: DC

## 2017-05-08 MED ORDER — DEXAMETHASONE SODIUM PHOSPHATE 10 MG/ML IJ SOLN
INTRAMUSCULAR | Status: AC
  Filled 2017-05-08: qty 1

## 2017-05-08 MED ORDER — DEXAMETHASONE SODIUM PHOSPHATE 10 MG/ML IJ SOLN
INTRAMUSCULAR | Status: DC | PRN
  Administered 2017-05-08: 10 mg via INTRAVENOUS

## 2017-05-08 MED ORDER — FENTANYL CITRATE (PF) 100 MCG/2ML IJ SOLN
50.0000 ug | INTRAMUSCULAR | Status: AC | PRN
  Administered 2017-05-08 (×2): 50 ug via INTRAVENOUS
  Administered 2017-05-08: 100 ug via INTRAVENOUS

## 2017-05-08 MED ORDER — LIDOCAINE HCL (CARDIAC) 20 MG/ML IV SOLN
INTRAVENOUS | Status: DC | PRN
  Administered 2017-05-08: 60 mg via INTRAVENOUS

## 2017-05-08 MED ORDER — DEXTROSE 5 % IV SOLN
3.0000 g | INTRAVENOUS | Status: AC
  Administered 2017-05-08: 3 g via INTRAVENOUS

## 2017-05-08 MED ORDER — PROPOFOL 10 MG/ML IV BOLUS
INTRAVENOUS | Status: DC | PRN
  Administered 2017-05-08: 200 mg via INTRAVENOUS
  Administered 2017-05-08: 100 mg via INTRAVENOUS

## 2017-05-08 MED ORDER — PROMETHAZINE HCL 25 MG/ML IJ SOLN
6.2500 mg | INTRAMUSCULAR | Status: DC | PRN
Start: 2017-05-08 — End: 2017-05-08

## 2017-05-08 MED ORDER — SCOPOLAMINE 1 MG/3DAYS TD PT72: 1.0000 | MEDICATED_PATCH | Freq: Once | TRANSDERMAL | Status: DC | PRN

## 2017-05-08 MED ORDER — BUPIVACAINE-EPINEPHRINE (PF) 0.5% -1:200000 IJ SOLN
INTRAMUSCULAR | Status: DC | PRN
  Administered 2017-05-08: 15 mL via PERINEURAL
  Administered 2017-05-08: 20 mL via PERINEURAL

## 2017-05-08 MED ORDER — KETOROLAC TROMETHAMINE 30 MG/ML IJ SOLN
INTRAMUSCULAR | Status: DC | PRN
  Administered 2017-05-08: 30 mg via INTRAVENOUS

## 2017-05-08 MED ORDER — HYDROCODONE-ACETAMINOPHEN 7.5-325 MG PO TABS: 1.0000 | ORAL_TABLET | Freq: Once | ORAL | Status: DC | PRN

## 2017-05-08 SURGICAL SUPPLY — 88 items
BANDAGE ACE 4X5 VEL STRL LF (GAUZE/BANDAGES/DRESSINGS) ×3 IMPLANT
BANDAGE ACE 6X5 VEL STRL LF (GAUZE/BANDAGES/DRESSINGS) ×1 IMPLANT
BANDAGE ESMARK 6X9 LF (GAUZE/BANDAGES/DRESSINGS) IMPLANT
BIT DRILL 2.5X2.75 QC CALB (BIT) ×1 IMPLANT
BIT DRILL 2.9X70 QC CALB (BIT) ×1 IMPLANT
BIT DRILL 3.5X5.5 QC CALB (BIT) ×1 IMPLANT
BIT DRILL CALIBRATED 2.7 (BIT) ×1 IMPLANT
BLADE SURG 15 STRL LF DISP TIS (BLADE) ×2 IMPLANT
BLADE SURG 15 STRL SS (BLADE) ×3
BNDG CMPR 9X4 STRL LF SNTH (GAUZE/BANDAGES/DRESSINGS)
BNDG CMPR 9X6 STRL LF SNTH (GAUZE/BANDAGES/DRESSINGS) ×2
BNDG ESMARK 4X9 LF (GAUZE/BANDAGES/DRESSINGS) IMPLANT
BNDG ESMARK 6X9 LF (GAUZE/BANDAGES/DRESSINGS) ×3
BNDG GAUZE ELAST 4 BULKY (GAUZE/BANDAGES/DRESSINGS) IMPLANT
CANISTER SUCT 1200ML W/VALVE (MISCELLANEOUS) ×3 IMPLANT
COVER BACK TABLE 60X90IN (DRAPES) ×3 IMPLANT
COVER SURGICAL LIGHT HANDLE (MISCELLANEOUS) ×1 IMPLANT
DECANTER SPIKE VIAL GLASS SM (MISCELLANEOUS) IMPLANT
DRAPE EXTREMITY T 121X128X90 (DRAPE) ×3 IMPLANT
DRAPE OEC MINIVIEW 54X84 (DRAPES) ×3 IMPLANT
DRAPE U 20/CS (DRAPES) ×3 IMPLANT
DRAPE U-SHAPE 47X51 STRL (DRAPES) ×3 IMPLANT
DRSG EMULSION OIL 3X3 NADH (GAUZE/BANDAGES/DRESSINGS) ×2 IMPLANT
DRSG PAD ABDOMINAL 8X10 ST (GAUZE/BANDAGES/DRESSINGS) ×6 IMPLANT
DURAPREP 26ML APPLICATOR (WOUND CARE) ×3 IMPLANT
ELECT REM PT RETURN 9FT ADLT (ELECTROSURGICAL) ×3
ELECTRODE REM PT RTRN 9FT ADLT (ELECTROSURGICAL) ×2 IMPLANT
FIXATION ZIPTIGHT ANKLE SNDSMS (Ankle) IMPLANT
GAUZE SPONGE 4X4 12PLY STRL (GAUZE/BANDAGES/DRESSINGS) ×3 IMPLANT
GAUZE XEROFORM 1X8 LF (GAUZE/BANDAGES/DRESSINGS) ×1 IMPLANT
GLOVE BIO SURGEON STRL SZ7.5 (GLOVE) ×3 IMPLANT
GLOVE BIOGEL PI IND STRL 7.0 (GLOVE) IMPLANT
GLOVE BIOGEL PI IND STRL 8 (GLOVE) ×4 IMPLANT
GLOVE BIOGEL PI INDICATOR 7.0 (GLOVE) ×1
GLOVE BIOGEL PI INDICATOR 8 (GLOVE) ×2
GLOVE ECLIPSE 6.0 STRL STRAW (GLOVE) ×1 IMPLANT
GLOVE SURG ORTHO 8.0 STRL STRW (GLOVE) ×3 IMPLANT
GOWN STRL REUS W/ TWL LRG LVL3 (GOWN DISPOSABLE) ×2 IMPLANT
GOWN STRL REUS W/ TWL XL LVL3 (GOWN DISPOSABLE) ×2 IMPLANT
GOWN STRL REUS W/TWL LRG LVL3 (GOWN DISPOSABLE) ×3
GOWN STRL REUS W/TWL XL LVL3 (GOWN DISPOSABLE) ×6 IMPLANT
IMMOBILIZER KNEE 22 UNIV (SOFTGOODS) ×3 IMPLANT
IMMOBILIZER KNEE 24 THIGH 36 (MISCELLANEOUS) ×2 IMPLANT
IMMOBILIZER KNEE 24 UNIV (MISCELLANEOUS) ×3
K-WIRE ACE 1.6X6 (WIRE) ×6
KWIRE ACE 1.6X6 (WIRE) IMPLANT
NS IRRIG 1000ML POUR BTL (IV SOLUTION) ×3 IMPLANT
PACK ARTHROSCOPY DSU (CUSTOM PROCEDURE TRAY) ×3 IMPLANT
PACK BASIN DAY SURGERY FS (CUSTOM PROCEDURE TRAY) ×3 IMPLANT
PAD CAST 4YDX4 CTTN HI CHSV (CAST SUPPLIES) ×2 IMPLANT
PADDING CAST ABS 3INX4YD NS (CAST SUPPLIES)
PADDING CAST ABS 4INX4YD NS (CAST SUPPLIES) ×1
PADDING CAST ABS COTTON 3X4 (CAST SUPPLIES) IMPLANT
PADDING CAST ABS COTTON 4X4 ST (CAST SUPPLIES) ×2 IMPLANT
PADDING CAST COTTON 4X4 STRL (CAST SUPPLIES) ×3
PADDING CAST COTTON 6X4 STRL (CAST SUPPLIES) ×3 IMPLANT
PENCIL BUTTON HOLSTER BLD 10FT (ELECTRODE) ×3 IMPLANT
PLATE LOCK 8H 103 BILAT FIB (Plate) ×1 IMPLANT
SCREW ACE CAN 4.0 40M (Screw) ×2 IMPLANT
SCREW CORTICAL 3.5MM  20MM (Screw) ×1 IMPLANT
SCREW CORTICAL 3.5MM 20MM (Screw) IMPLANT
SCREW CORTICAL 3.5MM 22MM (Screw) ×1 IMPLANT
SCREW CORTICAL LOW PROF 3.5X20 (Screw) ×1 IMPLANT
SCREW LOCK CORT STAR 3.5X12 (Screw) ×1 IMPLANT
SCREW NON LOCKING LP 3.5 14MM (Screw) ×2 IMPLANT
SCREW NON LOCKING LP 3.5 16MM (Screw) ×1 IMPLANT
SHEET MEDIUM DRAPE 40X70 STRL (DRAPES) IMPLANT
SPLINT FAST PLASTER 5X30 (CAST SUPPLIES) ×30
SPLINT PLASTER CAST FAST 5X30 (CAST SUPPLIES) IMPLANT
SPONGE LAP 4X18 X RAY DECT (DISPOSABLE) ×3 IMPLANT
STAPLER VISISTAT 35W (STAPLE) IMPLANT
STOCKINETTE TUBULAR 6 INCH (GAUZE/BANDAGES/DRESSINGS) ×3 IMPLANT
SUCTION FRAZIER HANDLE 10FR (MISCELLANEOUS) ×1
SUCTION TUBE FRAZIER 10FR DISP (MISCELLANEOUS) ×2 IMPLANT
SUT ETHILON 3 0 PS 1 (SUTURE) ×4 IMPLANT
SUT ETHILON 4 0 PS 2 18 (SUTURE) IMPLANT
SUT VIC AB 0 CT1 27 (SUTURE)
SUT VIC AB 0 CT1 27XBRD ANBCTR (SUTURE) IMPLANT
SUT VIC AB 2-0 CT1 27 (SUTURE) ×3
SUT VIC AB 2-0 CT1 TAPERPNT 27 (SUTURE) IMPLANT
SUT VIC AB 2-0 PS2 27 (SUTURE) IMPLANT
SUT VIC AB 3-0 SH 27 (SUTURE) ×3
SUT VIC AB 3-0 SH 27X BRD (SUTURE) IMPLANT
SYR BULB 3OZ (MISCELLANEOUS) ×3 IMPLANT
TOWEL OR 17X24 6PK STRL BLUE (TOWEL DISPOSABLE) ×3 IMPLANT
TOWEL OR NON WOVEN STRL DISP B (DISPOSABLE) ×3 IMPLANT
UNDERPAD 30X30 (UNDERPADS AND DIAPERS) ×3 IMPLANT
ZIPTIGHT ANKLE SYNODESMOSS FIX (Ankle) ×3 IMPLANT

## 2017-05-08 NOTE — Progress Notes (Signed)
Assisted Dr. Foster with left, ultrasound guided, popliteal/saphenous, adductor canal block. Side rails up, monitors on throughout procedure. See vital signs in flow sheet. Tolerated Procedure well. 

## 2017-05-08 NOTE — Discharge Instructions (Signed)
Diet: As you were doing prior to hospitalization   Activity: Increase activity slowly as tolerated  No lifting or driving for 6 weeks   Shower: May shower but must keep splint clean and dry.  Dressing: You are to leave splint in place until follow up.  Weight Bearing: nonweight bearing left leg. Use a walker or  Crutches as instructed.   To prevent constipation: you may use a stool softener such as -  Colace ( over the counter) 100 mg by mouth twice a day  Drink plenty of fluids ( prune juice may be helpful) and high fiber foods  Miralax ( over the counter) for constipation as needed.   Precautions: If you experience chest pain or shortness of breath - call 911 immediately For transfer to the hospital emergency department!!  If you develop a fever greater that 101 F, purulent drainage from wound, increased redness or drainage from wound, or calf pain -- Call the office   Follow- Up Appointment: Please call for an appointment to be seen in 2 weeks  [place] [phone]   Post Anesthesia Home Care Instructions  Activity: Get plenty of rest for the remainder of the day. A responsible individual must stay with you for 24 hours following the procedure.  For the next 24 hours, DO NOT: -Drive a car -Advertising copywriterperate machinery -Drink alcoholic beverages -Take any medication unless instructed by your physician -Make any legal decisions or sign important papers.  Meals: Start with liquid foods such as gelatin or soup. Progress to regular foods as tolerated. Avoid greasy, spicy, heavy foods. If nausea and/or vomiting occur, drink only clear liquids until the nausea and/or vomiting subsides. Call your physician if vomiting continues.  Special Instructions/Symptoms: Your throat may feel dry or sore from the anesthesia or the breathing tube placed in your throat during surgery. If this causes discomfort, gargle with warm salt water. The discomfort should disappear within 24 hours.  If you had  a scopolamine patch placed behind your ear for the management of post- operative nausea and/or vomiting:  1. The medication in the patch is effective for 72 hours, after which it should be removed.  Wrap patch in a tissue and discard in the trash. Wash hands thoroughly with soap and water. 2. You may remove the patch earlier than 72 hours if you experience unpleasant side effects which may include dry mouth, dizziness or visual disturbances. 3. Avoid touching the patch. Wash your hands with soap and water after contact with the patch.   Regional Anesthesia Blocks  1. Numbness or the inability to move the "blocked" extremity may last from 3-48 hours after placement. The length of time depends on the medication injected and your individual response to the medication. If the numbness is not going away after 48 hours, call your surgeon.  2. The extremity that is blocked will need to be protected until the numbness is gone and the  Strength has returned. Because you cannot feel it, you will need to take extra care to avoid injury. Because it may be weak, you may have difficulty moving it or using it. You may not know what position it is in without looking at it while the block is in effect.  3. For blocks in the legs and feet, returning to weight bearing and walking needs to be done carefully. You will need to wait until the numbness is entirely gone and the strength has returned. You should be able to move your leg and foot normally  before you try and bear weight or walk. You will need someone to be with you when you first try to ensure you do not fall and possibly risk injury.  4. Bruising and tenderness at the needle site are common side effects and will resolve in a few days.  5. Persistent numbness or new problems with movement should be communicated to the surgeon or the [facility] [phone] [place] [phone]).

## 2017-05-08 NOTE — Anesthesia Postprocedure Evaluation (Signed)
Anesthesia Post Note  Patient: Cheyenne Wolfe  Procedure(s) Performed: Procedure(s) (LRB): OPEN REDUCTION INTERNAL FIXATION (ORIF) LEFT ANKLE FRACTURE WITH SYNDESMOSIS (Left) SYNDESMOSIS REPAIR     Patient location during evaluation: PACU Anesthesia Type: General Level of consciousness: awake and alert Pain management: pain level controlled Vital Signs Assessment: post-procedure vital signs reviewed and stable Respiratory status: spontaneous breathing, nonlabored ventilation and respiratory function stable Cardiovascular status: blood pressure returned to baseline and stable Postop Assessment: no signs of nausea or vomiting Anesthetic complications: no    Last Vitals:  Vitals:   05/08/17 1300 05/08/17 1315  BP: 127/78 117/87  Pulse: 70 85  Resp: (!) 22 19  Temp:      Last Pain:  Vitals:   05/08/17 1315  TempSrc:   PainSc: 1                  Shauntia Levengood A.

## 2017-05-08 NOTE — Brief Op Note (Signed)
05/08/2017  12:50 PM  PATIENT:  Cheyenne PortaAnnie C Wolfe  31 y.o. female  PRE-OPERATIVE DIAGNOSIS:  LEFT ANKLE FRACTURE  POST-OPERATIVE DIAGNOSIS:  LEFT ANKLE FRACTURE  PROCEDURE:  Procedure(s): OPEN REDUCTION INTERNAL FIXATION (ORIF) LEFT ANKLE FRACTURE WITH SYNDESMOSIS (Left) SYNDESMOSIS REPAIR  SURGEON:  Surgeon(s) and Role:    Frederico Hamman* Caffrey, Daniel, MD - Primary  PHYSICIAN ASSISTANT: Margart SicklesJoshua Rayna Brenner, PA-C  ASSISTANTS:    ANESTHESIA:   regional and general  EBL:  Total I/O In: 1200 [I.V.:1200] Out: -   BLOOD ADMINISTERED:none  DRAINS: none   LOCAL MEDICATIONS USED:  NONE  SPECIMEN:  No Specimen  DISPOSITION OF SPECIMEN:  N/A  COUNTS:  YES  TOURNIQUET:   Total Tourniquet Time Documented: Thigh (Left) - 86 minutes Total: Thigh (Left) - 86 minutes   DICTATION: .Other Dictation: Dictation Number unknown  PLAN OF CARE: Discharge to home after PACU  PATIENT DISPOSITION:  PACU - hemodynamically stable.   Delay start of Pharmacological VTE agent (>24hrs) due to surgical blood loss or risk of bleeding: not applicable

## 2017-05-08 NOTE — Transfer of Care (Signed)
Immediate Anesthesia Transfer of Care Note  Patient: Angie Favannie C Weissmann  Procedure(s) Performed: Procedure(s): OPEN REDUCTION INTERNAL FIXATION (ORIF) LEFT ANKLE FRACTURE WITH SYNDESMOSIS (Left) SYNDESMOSIS REPAIR  Patient Location: PACU  Anesthesia Type:GA combined with regional for post-op pain  Level of Consciousness: sedated  Airway & Oxygen Therapy: Patient Spontanous Breathing, Patient connected to nasal cannula oxygen and Patient connected to face mask oxygen  Post-op Assessment: Report given to RN and Post -op Vital signs reviewed and stable  Post vital signs: Reviewed and stable  Last Vitals:  Vitals:   05/08/17 1039 05/08/17 1040  BP: 119/68   Pulse:  74  Resp: (!) 22 14  Temp:      Last Pain:  Vitals:   05/08/17 0946  TempSrc: Oral  PainSc: 7       Patients Stated Pain Goal: 3 (05/08/17 0946)  Complications: No apparent anesthesia complications

## 2017-05-08 NOTE — Anesthesia Procedure Notes (Signed)
Procedure Name: LMA Insertion Date/Time: 05/08/2017 10:51 AM Performed by: Burna CashONRAD, Eleanore Junio C Pre-anesthesia Checklist: Patient identified, Emergency Drugs available, Suction available and Patient being monitored Patient Re-evaluated:Patient Re-evaluated prior to induction Oxygen Delivery Method: Circle system utilized Preoxygenation: Pre-oxygenation with 100% oxygen Induction Type: IV induction Ventilation: Mask ventilation without difficulty LMA: LMA inserted LMA Size: 4.0 Number of attempts: 1 Airway Equipment and Method: Bite block Placement Confirmation: positive ETCO2 Tube secured with: Tape Dental Injury: Teeth and Oropharynx as per pre-operative assessment

## 2017-05-08 NOTE — Interval H&P Note (Signed)
History and Physical Interval Note:  05/08/2017 10:34 AM  Arlis PortaAnnie C Wolfe  has presented today for surgery, with the diagnosis of LEFT ANKLE FRACTURE  The various methods of treatment have been discussed with the patient and family. After consideration of risks, benefits and other options for treatment, the patient has consented to  Procedure(s): OPEN REDUCTION INTERNAL FIXATION (ORIF) LEFT ANKLE FRACTURE (Left) as a surgical intervention .  The patient's history has been reviewed, patient examined, no change in status, stable for surgery.  I have reviewed the patient's chart and labs.  Questions were answered to the patient's satisfaction.     Frances Joynt JR,W D

## 2017-05-08 NOTE — H&P (View-Only) (Signed)
Cheyenne Wolfe is an 31 y.o. female.   Chief Complaint: left ankle fracture HPI: patient was seen in outpatient setting for ER follow up left ankle fracture which occurred 05/01/17.  She was xrayed in the [facility] ER showing bimalleolar ankle fracture placed in splint and given crutches.    Past Medical History:  Diagnosis Date  . Ankle fracture 04/2017   left  . History of asthma    no current med.  Marland Kitchen. History of seizures    no seizures in > 10 yr. - no current med.  . Migraines   . Obesity     Past Surgical History:  Procedure Laterality Date  . BUNIONECTOMY Bilateral     Family History  Problem Relation Age of Onset  . Hypertension Mother   . Diabetes Mother   . Schizophrenia Father   . Asthma Brother   . Hypertension Other   . Cancer Other    Social History:  reports that she has never smoked. She has never used smokeless tobacco. She reports that she does not drink alcohol or use drugs.  Allergies: No Known Allergies   (Not in a hospital admission)  No results found for this or any previous visit (from the past 48 hour(s)). No results found.  Review of Systems  Musculoskeletal: Positive for falls and joint pain.  Neurological: Positive for seizures and headaches.  Psychiatric/Behavioral: Positive for depression.  All other systems reviewed and are negative.   There were no vitals taken for this visit. Physical Exam  Constitutional: She is oriented to person, place, and time. She appears well-developed and well-nourished. No distress.  HENT:  Head: Normocephalic and atraumatic.  Eyes: Pupils are equal, round, and reactive to light. Conjunctivae and EOM are normal.  Neck: Normal range of motion. Neck supple.  Cardiovascular: Normal rate and intact distal pulses.   Respiratory: Effort normal. No respiratory distress.  GI: Soft. She exhibits no distension. There is no tenderness.  Musculoskeletal:       Left ankle: She exhibits decreased range of motion and  swelling. She exhibits no laceration. Tenderness. Lateral malleolus and medial malleolus tenderness found.  Neurological: She is alert and oriented to person, place, and time.  Skin: Skin is warm and dry. No rash noted. No erythema.  Psychiatric: She has a normal mood and affect. Her behavior is normal.     Assessment/Plan Left bimalleolar ankle fracture   Discussed risks and benefits of ORIF left ankle and patient wishes to proceed.  This will be done outpatient with general anesthesia.    Cheyenne Wolfe, Cheyenne Strycharz, PA-C 05/06/2017, 1:15 PM

## 2017-05-08 NOTE — Anesthesia Preprocedure Evaluation (Signed)
Anesthesia Evaluation  Patient identified by MRN, date of birth, ID band Patient awake    Reviewed: Allergy & Precautions, NPO status , Patient's Chart, lab work & pertinent test results  Airway Mallampati: II  TM Distance: >3 FB Neck ROM: Full    Dental no notable dental hx. (+) Teeth Intact   Pulmonary asthma ,    Pulmonary exam normal breath sounds clear to auscultation       Cardiovascular negative cardio ROS Normal cardiovascular exam Rhythm:Regular Rate:Normal     Neuro/Psych  Headaches, Seizures -, Well Controlled,  Remote hx/o Sz- last 21 years ago    GI/Hepatic negative GI ROS, Neg liver ROS,   Endo/Other  Morbid obesity  Renal/GU negative Renal ROS  negative genitourinary   Musculoskeletal Left ankle Fx   Abdominal (+) + obese,   Peds  Hematology negative hematology ROS (+)   Anesthesia Other Findings   Reproductive/Obstetrics                             Anesthesia Physical Anesthesia Plan  ASA: III  Anesthesia Plan: General   Post-op Pain Management:  Regional for Post-op pain   Induction:   PONV Risk Score and Plan: 3 and Ondansetron, Dexamethasone, Propofol and Midazolam  Airway Management Planned: LMA  Additional Equipment:   Intra-op Plan:   Post-operative Plan: Extubation in OR  Informed Consent: I have reviewed the patients History and Physical, chart, labs and discussed the procedure including the risks, benefits and alternatives for the proposed anesthesia with the patient or authorized representative who has indicated his/her understanding and acceptance.   Dental advisory given  Plan Discussed with: CRNA, Anesthesiologist and Surgeon  Anesthesia Plan Comments:         Anesthesia Quick Evaluation

## 2017-05-08 NOTE — Anesthesia Procedure Notes (Signed)
Anesthesia Regional Block: Popliteal block   Pre-Anesthetic Checklist: ,, timeout performed, Correct Patient, Correct Site, Correct Laterality, Correct Procedure, Correct Position, site marked, Risks and benefits discussed,  Surgical consent,  Pre-op evaluation,  At surgeon's request and post-op pain management  Laterality: Left  Prep: chloraprep       Needles:  Injection technique: Single-shot  Needle Type: Echogenic Stimulator Needle     Needle Length: 9cm  Needle Gauge: 21   Needle insertion depth: 7 cm   Additional Needles:   Procedures: ultrasound guided,,,,,,,,  Narrative:  Start time: 05/08/2017 10:27 AM End time: 05/08/2017 10:32 AM Injection made incrementally with aspirations every 5 mL.  Performed by: Personally  Anesthesiologist: Mal AmabileFOSTER, Elisabeth Strom  Additional Notes: Timeout performed. Patient sedated. Relevant anatomy ID'd using US. Incremental 2-435ml injection of LA with frequent aspiration. Patient tolerated procedure well.

## 2017-05-08 NOTE — Op Note (Signed)
NAME:  Cheyenne Wolfe, Cheyenne Wolfe              ACCOUNT NO.:  [id number]  MEDICAL RECORD NO.:  [id number]  LOCATION:                                 FACILITY:  PHYSICIAN:  Dyke BrackettW. D. Jadakiss Barish, M.D.         DATE OF BIRTH:  DATE OF PROCEDURE:  05/08/2017 DATE OF DISCHARGE:                              OPERATIVE REPORT   PREOPERATIVE DIAGNOSIS:  Displaced trimalleolar ankle fracture with disruption of syndesmosis.  POSTOPERATIVE DIAGNOSIS:  Displaced trimalleolar ankle fracture with disruption of syndesmosis.  OPERATION: 1. Open reduction internal fixation, trimalleolar ankle fracture. 2. Open reduction internal fixation, syndesmosis.  SURGEON:  Dyke BrackettW. D. Eileen Kangas, M.D.  ASSISTANVincent Peyer:  [place].  TOURNIQUET TIME:  Approximately 1 hour 30 minutes.  DESCRIPTION OF PROCEDURE:  Supine positioning, general anesthetic, exsanguination of the leg, inflation of thigh tourniquet 375.  Lateral incision was made.  We approached the fibula and provided a provisional lag screw with poor purchase in the bone, but did help reduction.  We used a Biomet composite plate locking.  We placed 3 cortical screws proximal and we only retained 2 distal screws due to the fracture obliquity, one of these was a locking screw.  We then approached the medial side from a separate incision and placed 2 cancellous screws, 4.0 cancellous titanium screws with excellent reduction, then tested stability.  We noted medial widening and possible disruption of the syndesmosis and elected to place the Biomet syndesmotic fixation device through a drill hole.  We tightened this up and observed deployment of the Endobutton on the opposite side and resolution of the medial instability and good restoration of the syndesmosis.  Closure was affected with 0 and 2-0 Vicryl and nylon on the skin.  Lightly compressive sterile dressing and posterior splint applied.  Taken to recovery room in stable condition.     Dyke BrackettW. D. Flay Ghosh,  M.D.   ______________________________ Dyke BrackettW. D. Yanely Mast, M.D.    WDC/MEDQ  D:  05/08/2017  T:  05/08/2017  Job:  [id number]

## 2017-05-08 NOTE — Anesthesia Procedure Notes (Addendum)
Anesthesia Regional Block: Adductor canal block   Pre-Anesthetic Checklist: ,, timeout performed, Correct Patient, Correct Site, Correct Laterality, Correct Procedure, Correct Position, site marked, Risks and benefits discussed,  Surgical consent,  Pre-op evaluation,  At surgeon's request and post-op pain management  Laterality: Left  Prep: chloraprep       Needles:  Injection technique: Single-shot  Needle Type: Echogenic Stimulator Needle     Needle Length: 9cm  Needle Gauge: 21   Needle insertion depth: 9 cm   Additional Needles:   Procedures: ultrasound guided,,,,,,,,  Narrative:  Start time: 05/08/2017 10:33 AM End time: 05/08/2017 10:39 AM Injection made incrementally with aspirations every 5 mL.  Performed by: Personally  Anesthesiologist: Mal AmabileFOSTER, Nemesis Rainwater  Additional Notes: Relevant anatomy ID'd using US. Incremental 3-365ml injection with frequent aspiration. Patient tolerated procedure well.

## 2021-02-06 ENCOUNTER — Other Ambulatory Visit: Payer: Self-pay

## 2021-02-06 ENCOUNTER — Encounter (HOSPITAL_COMMUNITY): Payer: Self-pay | Admitting: *Deleted

## 2021-02-06 ENCOUNTER — Emergency Department (HOSPITAL_COMMUNITY)
Admission: EM | Admit: 2021-02-06 | Discharge: 2021-02-06 | Disposition: A | Discharge status: Home / Self Care | Payer: Self-pay | Attending: Emergency Medicine | Admitting: Emergency Medicine

## 2021-02-06 ENCOUNTER — Emergency Department (HOSPITAL_COMMUNITY): Payer: Self-pay

## 2021-02-06 DIAGNOSIS — J45909 Unspecified asthma, uncomplicated: Secondary | ICD-10-CM | POA: Insufficient documentation

## 2021-02-06 DIAGNOSIS — X58XXXA Exposure to other specified factors, initial encounter: Secondary | ICD-10-CM | POA: Insufficient documentation

## 2021-02-06 DIAGNOSIS — S39012A Strain of muscle, fascia and tendon of lower back, initial encounter: Secondary | ICD-10-CM | POA: Insufficient documentation

## 2021-02-06 HISTORY — DX: Anxiety disorder, unspecified: F41.9

## 2021-02-06 MED ORDER — NAPROXEN 500 MG PO TABS
500.0000 mg | ORAL_TABLET | Freq: Two times a day (BID) | ORAL | 0 refills | Status: AC
Start: 2021-02-06 — End: ?

## 2021-02-06 MED ORDER — OXYCODONE-ACETAMINOPHEN 5-325 MG PO TABS: 1.0000 | ORAL_TABLET | Freq: Four times a day (QID) | ORAL | 0 refills | Status: DC | PRN

## 2021-02-06 NOTE — ED Triage Notes (Signed)
Back pain x 1 week, taking otc meds without relief

## 2021-02-06 NOTE — ED Provider Notes (Signed)
[facility] EMERGENCY DEPARTMENT Provider Note   CSN: [id number] Arrival date & time: 02/06/21  1317     History Chief Complaint  Patient presents with  . Back Pain    Cheyenne Wolfe is a 35 y.o. female.  Patient complains of lower back pain for a number days.  Worse with movement.  No history of trauma  The history is provided by the patient and medical records.  Back Pain Location:  Generalized Quality:  Aching Pain severity:  Moderate Pain is:  Worse during the day Onset quality:  Sudden Timing:  Constant Progression:  Worsening Chronicity:  New Context: not emotional stress   Relieved by:  Nothing Worsened by:  Nothing Ineffective treatments:  None tried Associated symptoms: no abdominal pain, no chest pain and no headaches        Past Medical History:  Diagnosis Date  . Ankle fracture 04/2017   left  . Anxiety   . History of asthma    no current med.  Marland Kitchen History of seizures    no seizures in > 10 yr. - no current med.  . Migraines   . Obesity     There are no problems to display for this patient.   Past Surgical History:  Procedure Laterality Date  . BUNIONECTOMY Bilateral   . ORIF ANKLE FRACTURE Left 05/08/2017   Procedure: OPEN REDUCTION INTERNAL FIXATION (ORIF) LEFT ANKLE FRACTURE WITH SYNDESMOSIS;  Surgeon: Frederico Hamman, MD;  Location: [facility];  Service: Orthopedics;  Laterality: Left;  . SYNDESMOSIS REPAIR  05/08/2017   Procedure: SYNDESMOSIS REPAIR;  Surgeon: Frederico Hamman, MD;  Location: [facility];  Service: Orthopedics;;     OB History    Gravida  3   Para  2   Term  1   Preterm  1   AB      Living  2     SAB      IAB      Ectopic      Multiple      Live Births              Family History  Problem Relation Age of Onset  . Hypertension Mother   . Diabetes Mother   . Schizophrenia Father   . Asthma Brother   . Hypertension Other   . Cancer Other     Social History    Tobacco Use  . Smoking status: Never Smoker  . Smokeless tobacco: Never Used  Vaping Use  . Vaping Use: Never used  Substance Use Topics  . Alcohol use: No  . Drug use: No    Home Medications Prior to Admission medications   Medication Sig Start Date End Date Taking? Authorizing Provider  albuterol (VENTOLIN HFA) 108 (90 Base) MCG/ACT inhaler Inhale into the lungs. 04/15/19  Yes [provider]  ipratropium (ATROVENT) 0.03 % nasal spray Place 2 sprays into both nostrils 3 (three) times daily. 04/15/19  Yes [provider]  naproxen (NAPROSYN) 500 MG tablet Take 1 tablet (500 mg total) by mouth 2 (two) times daily. 02/06/21  Yes Bethann Berkshire, MD  oxyCODONE-acetaminophen (PERCOCET) 5-325 MG tablet Take 1 tablet by mouth every 6 (six) hours as needed. 02/06/21  Yes Bethann Berkshire, MD  diphenhydrAMINE (BENADRYL) 25 mg capsule Take by mouth.    [provider]  ibuprofen (ADVIL) 200 MG tablet Take by mouth.    [provider]  lamoTRIgine (LAMICTAL) 200 MG tablet Take 200 mg by  mouth daily. 02/02/21   [provider]  phentermine 37.5 MG capsule Take 37.5 mg by mouth every morning. 10/24/20   [provider]  QUEtiapine (SEROQUEL) 50 MG tablet Take 25-50 mg by mouth daily. 11/28/20   [provider]  topiramate (TOPAMAX) 50 MG tablet Take 150 mg by mouth daily. 11/05/20   [provider]  venlafaxine XR (EFFEXOR-XR) 150 MG 24 hr capsule Take 150 mg by mouth daily with breakfast.    [provider]    Allergies    Patient has no known allergies.  Review of Systems   Review of Systems  Constitutional: Negative for appetite change and fatigue.  HENT: Negative for congestion, ear discharge and sinus pressure.   Eyes: Negative for discharge.  Respiratory: Negative for cough.   Cardiovascular: Negative for chest pain.  Gastrointestinal: Negative for abdominal pain and diarrhea.  Genitourinary: Negative for  frequency and hematuria.  Musculoskeletal: Positive for back pain.  Skin: Negative for rash.  Neurological: Negative for seizures and headaches.  Psychiatric/Behavioral: Negative for hallucinations.    Physical Exam Updated Vital Signs BP (!) 130/95   Pulse 78   Temp 98.5 F (36.9 C) (Oral)   Resp 16   Ht 5\' 3"  (1.6 m)   Wt 132.5 kg   LMP 01/16/2021   SpO2 99%   BMI 51.73 kg/m   Physical Exam Vitals and nursing note reviewed.  Constitutional:      Appearance: She is well-developed.  HENT:     Head: Normocephalic.     Nose: Nose normal.  Eyes:     General: No scleral icterus.    Conjunctiva/sclera: Conjunctivae normal.  Neck:     Thyroid: No thyromegaly.  Cardiovascular:     Rate and Rhythm: Normal rate and regular rhythm.     Heart sounds: No murmur heard. No friction rub. No gallop.   Pulmonary:     Breath sounds: No stridor. No wheezing or rales.  Chest:     Chest wall: No tenderness.  Abdominal:     General: There is no distension.     Tenderness: There is no abdominal tenderness. There is no rebound.  Musculoskeletal:        General: Normal range of motion.     Cervical back: Neck supple.     Comments: Tender lumbar spine worse with movement  Lymphadenopathy:     Cervical: No cervical adenopathy.  Skin:    Findings: No erythema or rash.  Neurological:     Mental Status: She is alert and oriented to person, place, and time.     Motor: No abnormal muscle tone.     Coordination: Coordination normal.  Psychiatric:        Behavior: Behavior normal.     ED Results / Procedures / Treatments   Labs (all labs ordered are listed, but only abnormal results are displayed) Labs Reviewed - No data to display  EKG None  Radiology DG Lumbar Spine Complete  Result Date: 02/06/2021 CLINICAL DATA:  Low back pain EXAM: LUMBAR SPINE - COMPLETE 4+ VIEW COMPARISON:  None. FINDINGS: Frontal, lateral, spot lumbosacral lateral, and bilateral oblique views were  obtained. There are 5 non-rib-bearing lumbar type vertebral bodies. There is no fracture or spondylolisthesis. Disc spaces appear normal. There is no appreciable facet arthropathy. IMPRESSION: No fracture or spondylolisthesis.  No evident arthropathy. Electronically Signed   By: 02/08/2021 III M.D.   On: 02/06/2021 16:16    Procedures Procedures   Medications Ordered  in ED Medications - No data to display  ED Course  I have reviewed the triage vital signs and the nursing notes.  Pertinent labs & imaging results that were available during my care of the patient were reviewed by me and considered in my medical decision making (see chart for details).    MDM Rules/Calculators/A&P                          Patient with lumbar strain.  She has tried Naprosyn and Percocets if needed.  She will not lift anything heavy for 1 week and follow-up with her PCP if not improving Final Clinical Impression(s) / ED Diagnoses Final diagnoses:  Strain of lumbar region, initial encounter    Rx / DC Orders ED Discharge Orders         Ordered    naproxen (NAPROSYN) 500 MG tablet  2 times daily        02/06/21 1630    oxyCODONE-acetaminophen (PERCOCET) 5-325 MG tablet  Every 6 hours PRN        02/06/21 1630           Bethann Berkshire, MD 02/06/21 1638

## 2021-02-06 NOTE — ED Notes (Signed)
Pt verbalized understanding of no driving and to use caution within 4 hours of taking pain meds due to meds cause drowsiness.  Also pt is aware that Percocet can cause constipation.

## 2021-02-06 NOTE — Discharge Instructions (Addendum)
No lifting over 10 pounds for the next week.  Follow-up with your family doctor if not improving next week.

## 2021-06-24 ENCOUNTER — Encounter (HOSPITAL_COMMUNITY): Payer: Self-pay

## 2021-06-24 ENCOUNTER — Other Ambulatory Visit: Payer: Self-pay

## 2021-06-24 ENCOUNTER — Emergency Department (HOSPITAL_COMMUNITY)
Admission: EM | Admit: 2021-06-24 | Discharge: 2021-06-24 | Disposition: A | Discharge status: Home / Self Care | Payer: BC Managed Care – PPO | Attending: Emergency Medicine | Admitting: Emergency Medicine

## 2021-06-24 DIAGNOSIS — S70361A Insect bite (nonvenomous), right thigh, initial encounter: Secondary | ICD-10-CM | POA: Insufficient documentation

## 2021-06-24 DIAGNOSIS — W57XXXA Bitten or stung by nonvenomous insect and other nonvenomous arthropods, initial encounter: Secondary | ICD-10-CM | POA: Insufficient documentation

## 2021-06-24 DIAGNOSIS — J45909 Unspecified asthma, uncomplicated: Secondary | ICD-10-CM | POA: Diagnosis not present

## 2021-06-24 DIAGNOSIS — Z7951 Long term (current) use of inhaled steroids: Secondary | ICD-10-CM | POA: Insufficient documentation

## 2021-06-24 HISTORY — DX: Bipolar disorder, unspecified: F31.9

## 2021-06-24 MED ORDER — DOXYCYCLINE HYCLATE 100 MG PO CAPS: 100.0000 mg | ORAL_CAPSULE | Freq: Two times a day (BID) | ORAL | 0 refills | Status: AC

## 2021-06-24 NOTE — ED Provider Notes (Signed)
[facility] EMERGENCY DEPARTMENT Provider Note   CSN: [id number] Arrival date & time: 06/24/21  2054     History Chief Complaint  Patient presents with   Insect Bite    Cheyenne Wolfe is a 35 y.o. female.  HPI  Patient with no significant medical history presents to the emergency department with chief complaint of a bug bite on her her thigh.  Patient states she noticed it about 2 days ago, and states has gotten larger in size she denies  drainage or discharge from the area, states it does not hurt, does not sting, does not itch.  She thinks something bit her but unsure of what exactly it was.  She has no other symptoms, she does not endorse fevers, chills, systemic rash, joint pain, headaches, change in vision, paresthesia weakness upper or lower extremities.  She is not immunocompromise, is up-to-date on her tetanus shot, has no other complaints  Past Medical History:  Diagnosis Date   Ankle fracture 04/2017   left   Anxiety    Bipolar 1 disorder (HCC)    History of asthma    no current med.   History of seizures    no seizures in > 10 yr. - no current med.   Migraines    Obesity     There are no problems to display for this patient.   Past Surgical History:  Procedure Laterality Date   BUNIONECTOMY Bilateral    ORIF ANKLE FRACTURE Left 05/08/2017   Procedure: OPEN REDUCTION INTERNAL FIXATION (ORIF) LEFT ANKLE FRACTURE WITH SYNDESMOSIS;  Surgeon: Frederico Hamman, MD;  Location: [facility];  Service: Orthopedics;  Laterality: Left;   SYNDESMOSIS REPAIR  05/08/2017   Procedure: SYNDESMOSIS REPAIR;  Surgeon: Frederico Hamman, MD;  Location: [facility];  Service: Orthopedics;;     OB History     Gravida  3   Para  2   Term  1   Preterm  1   AB      Living  2      SAB      IAB      Ectopic      Multiple      Live Births              Family History  Problem Relation Age of Onset   Hypertension Mother     Diabetes Mother    Schizophrenia Father    Asthma Brother    Hypertension Other    Cancer Other     Social History   Tobacco Use   Smoking status: Never   Smokeless tobacco: Never  Vaping Use   Vaping Use: Never used  Substance Use Topics   Alcohol use: No   Drug use: No    Home Medications Prior to Admission medications   Medication Sig Start Date End Date Taking? Authorizing Provider  doxycycline (VIBRAMYCIN) 100 MG capsule Take 1 capsule (100 mg total) by mouth 2 (two) times daily for 7 days. 06/24/21 07/01/21 Yes Carroll Sage, PA-C  albuterol (VENTOLIN HFA) 108 (90 Base) MCG/ACT inhaler Inhale into the lungs. 04/15/19   [provider]  diphenhydrAMINE (BENADRYL) 25 mg capsule Take by mouth.    [provider]  ibuprofen (ADVIL) 200 MG tablet Take by mouth.    [provider]  ipratropium (ATROVENT) 0.03 % nasal spray Place 2 sprays into both nostrils 3 (three) times daily. 04/15/19   [provider]  lamoTRIgine (LAMICTAL) 200 MG  tablet Take 200 mg by mouth daily. 02/02/21   [provider]  naproxen (NAPROSYN) 500 MG tablet Take 1 tablet (500 mg total) by mouth 2 (two) times daily. 02/06/21   Bethann Berkshire, MD  oxyCODONE-acetaminophen (PERCOCET) 5-325 MG tablet Take 1 tablet by mouth every 6 (six) hours as needed. 02/06/21   Bethann Berkshire, MD  phentermine 37.5 MG capsule Take 37.5 mg by mouth every morning. 10/24/20   [provider]  QUEtiapine (SEROQUEL) 50 MG tablet Take 25-50 mg by mouth daily. 11/28/20   [provider]  topiramate (TOPAMAX) 50 MG tablet Take 150 mg by mouth daily. 11/05/20   [provider]  venlafaxine XR (EFFEXOR-XR) 150 MG 24 hr capsule Take 150 mg by mouth daily with breakfast.    [provider]    Allergies    Patient has no known allergies.  Review of Systems   Review of Systems  Constitutional:  Negative for chills and fever.  HENT:  Negative for congestion.    Respiratory:  Negative for shortness of breath.   Cardiovascular:  Negative for chest pain.  Gastrointestinal:  Negative for abdominal pain.  Genitourinary:  Negative for enuresis.  Musculoskeletal:  Negative for back pain.  Skin:  Positive for rash.  Neurological:  Negative for dizziness and headaches.  Hematological:  Does not bruise/bleed easily.   Physical Exam Updated Vital Signs BP 131/88 (BP Location: Right Wrist)   Pulse 75   Temp 98.6 F (37 C) (Oral)   Resp 20   Ht 5\' 2"  (1.575 m)   Wt 131.5 kg   LMP 06/03/2021   SpO2 98%   BMI 53.04 kg/m   Physical Exam Vitals and nursing note reviewed.  Constitutional:      General: She is not in acute distress.    Appearance: She is not ill-appearing.  HENT:     Head: Normocephalic and atraumatic.     Nose: No congestion.  Eyes:     Extraocular Movements: Extraocular movements intact.     Conjunctiva/sclera: Conjunctivae normal.  Cardiovascular:     Rate and Rhythm: Normal rate and regular rhythm.     Pulses: Normal pulses.     Heart sounds: No murmur heard.   No friction rub. No gallop.  Pulmonary:     Effort: No respiratory distress.     Breath sounds: No wheezing, rhonchi or rales.  Musculoskeletal:     Comments: Patient is moving all 4 extremities without difficulty.  Skin:    General: Skin is warm and dry.     Comments: Patient has a rash on her right upper thigh located in the lateral aspect, has surrounding ecchymosis erythema on the inside with a papule in the middle.  No drainage or discharge present, area is not warm to the touch, nontender to palpation, no fluctuant induration noted.  Please see picture for full detail.  No noted rashes on patient's hands and/or feet, no erythematous joints present.  Neurological:     Mental Status: She is alert.     Comments: No facial asymmetry, no difficulty word finding, able to follow two-step commands, no unilateral weakness present.  Psychiatric:        Mood and  Affect: Mood normal.     ED Results / Procedures / Treatments   Labs (all labs ordered are listed, but only abnormal results are displayed) Labs Reviewed - No data to display  EKG None  Radiology No results found.  Procedures Procedures   Medications Ordered  in ED Medications - No data to display  ED Course  I have reviewed the triage vital signs and the nursing notes.  Pertinent labs & imaging results that were available during my care of the patient were reviewed by me and considered in my medical decision making (see chart for details).    MDM Rules/Calculators/A&P                          Initial impression-patient presents with a bug bite.  She is alert, does not appear to be in acute distress, vital signs reassuring.  Work-up-due to well-appearing patient, benign physical exam, further lab and imaging are not warranted at this time.  Rule out-I have low suspicion for systemic infection as patient is nontoxic-appearing, vital signs reassuring.  I have low suspicion for TM and/or Trudie Buckler is no noted skin sloughing, no systemic rash present.  Low suspicion for tickborne illness as rash is atypical, no neurological symptoms, no heart murmur, no rash on the hands or feet.  Plan-  Rash-likely a benign bug bites,  possible could be an atypical Lyme disease and/or early cellulitis will start patient on antibiotics however follow-up with PCP for further evaluation.  Vital signs have remained stable, no indication for hospital admission.  Patient given at home care as well strict return precautions.  Patient verbalized that they understood agreed to said plan.  Final Clinical Impression(s) / ED Diagnoses Final diagnoses:  Insect bite of right thigh, initial encounter    Rx / DC Orders ED Discharge Orders          Ordered    doxycycline (VIBRAMYCIN) 100 MG capsule  2 times daily        06/24/21 2140             Carroll Sage, PA-C 06/24/21 2141     Jacalyn Lefevre, MD 06/24/21 2142

## 2021-06-24 NOTE — Discharge Instructions (Addendum)
Started you on antibiotics please take as prescribed.  Please follow-up with your PCP as needed.  Come back to the emergency department if you develop chest pain, shortness of breath, severe abdominal pain, uncontrolled nausea, vomiting, diarrhea.

## 2021-06-24 NOTE — ED Notes (Signed)
Pt provided discharge instructions and prescription information. Pt was given the opportunity to ask questions and questions were answered. Discharge signature not obtained in the setting of the COVID-19 pandemic in order to reduce high touch surfaces.  ° °

## 2021-06-24 NOTE — ED Triage Notes (Signed)
Pt believes to have an insect bite to the upper R thigh. Pt states there is a hard bump with surrounding area of discoloration. Pt denies fevers.

## 2023-02-20 ENCOUNTER — Encounter: Payer: Self-pay | Admitting: Neurology

## 2023-03-02 IMAGING — DX DG LUMBAR SPINE COMPLETE 4+V
5 series · 5 of 5 positions shown · non-contrast
Comparison: None.

CLINICAL DATA: Low back pain

EXAM:
LUMBAR SPINE - COMPLETE 4+ VIEW

[l-spine ap]
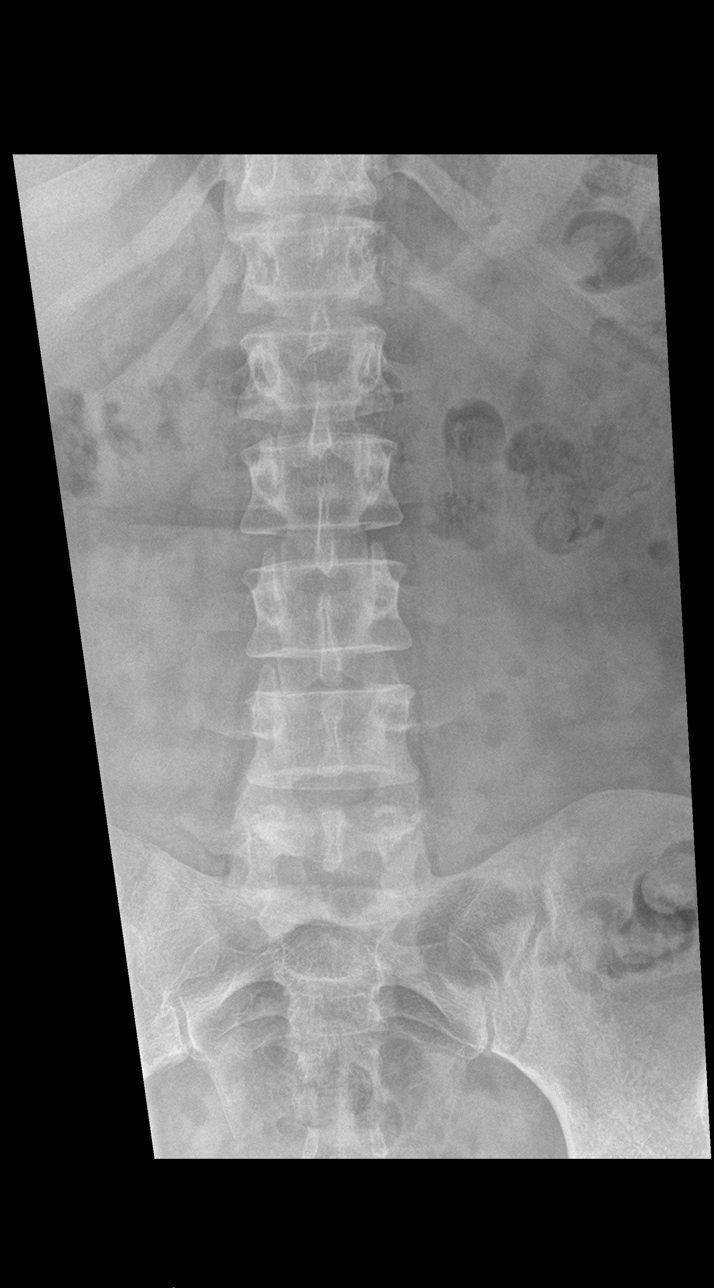

[l-spine obl (1 of 2)]
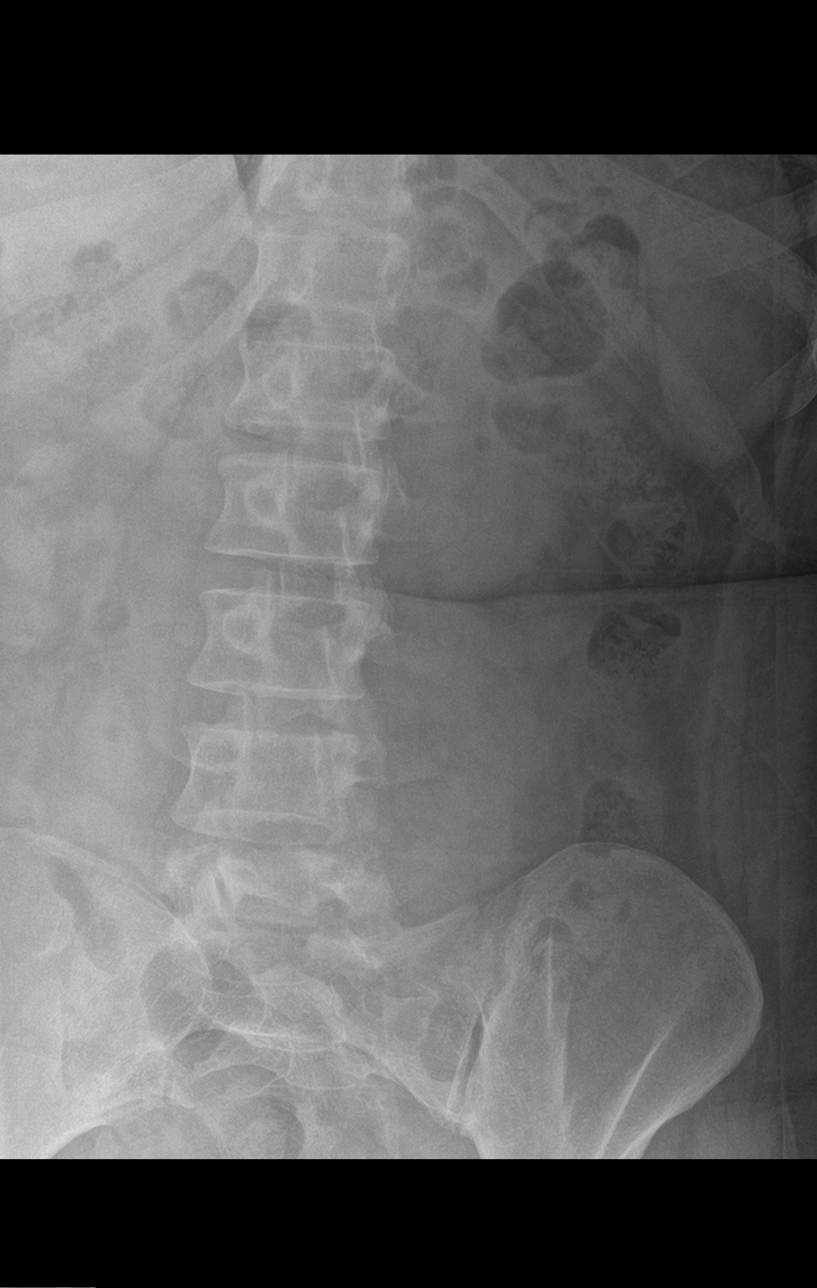

[l-spine obl (2 of 2)]
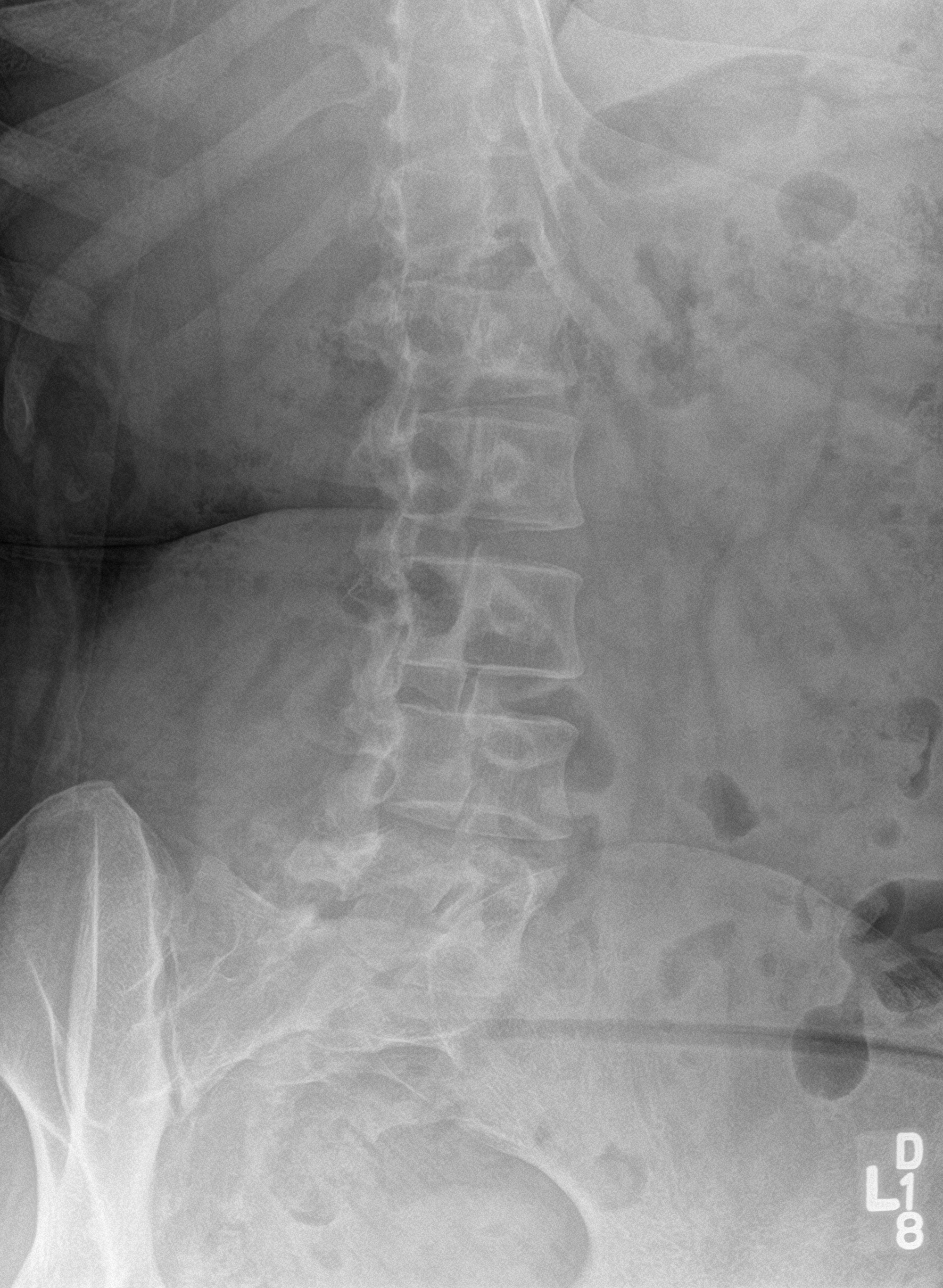

[l-spine lat]
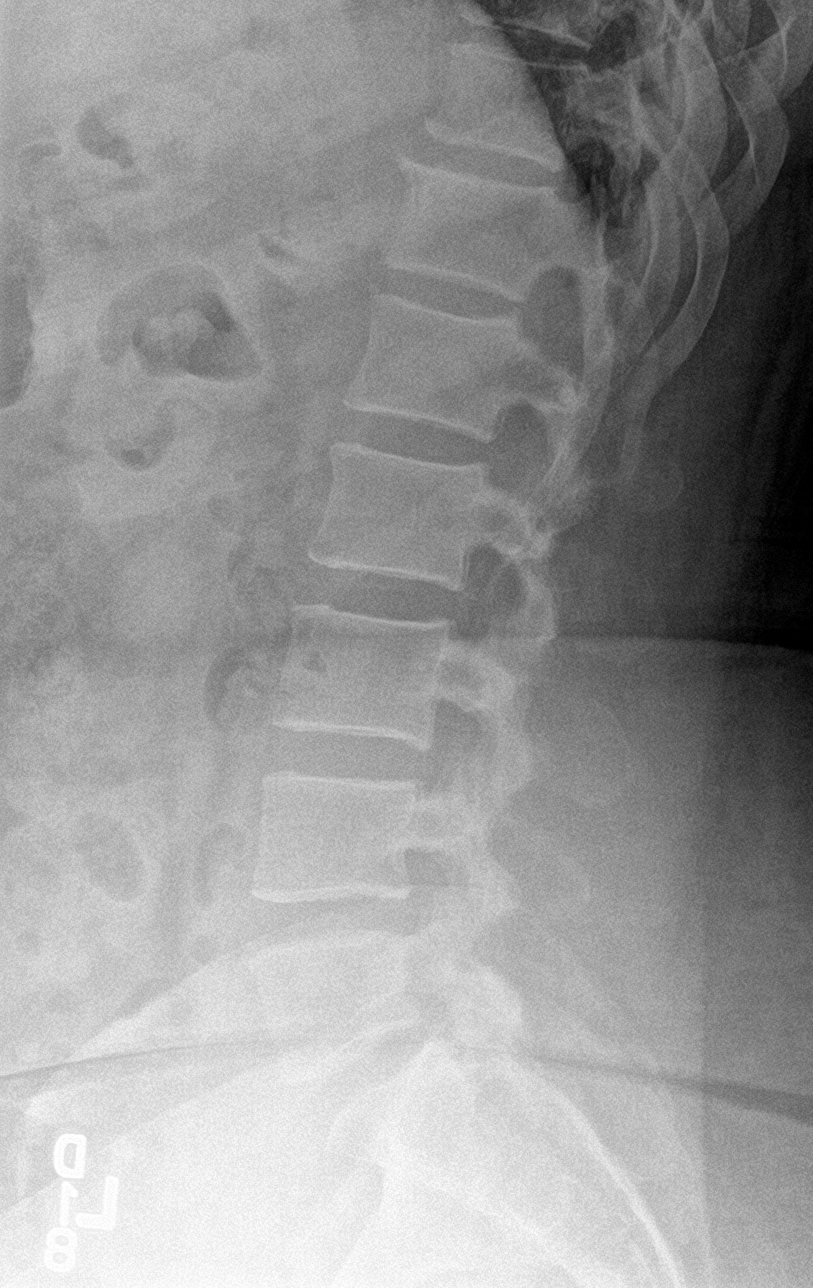

[l-spine spot]
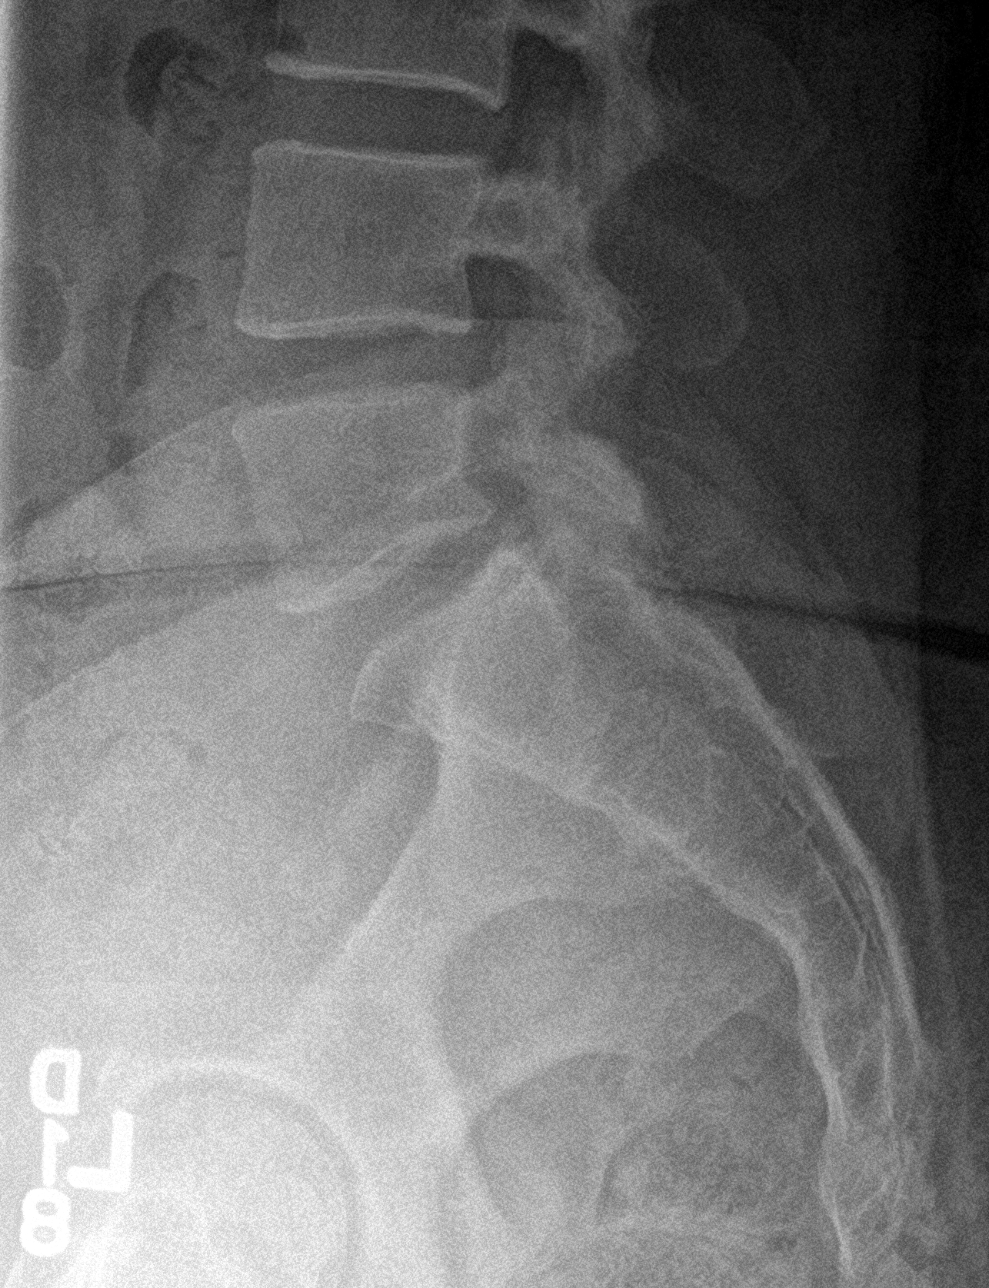

[5 of 5 positions shown; findings below may reference images not displayed]

FINDINGS: Frontal, lateral, spot lumbosacral lateral, and bilateral oblique
views were obtained. There are 5 non-rib-bearing lumbar type
vertebral bodies. There is no fracture or spondylolisthesis. Disc
spaces appear normal. There is no appreciable facet arthropathy.
IMPRESSION: No fracture or spondylolisthesis.  No evident arthropathy.

## 2023-06-04 ENCOUNTER — Ambulatory Visit (INDEPENDENT_AMBULATORY_CARE_PROVIDER_SITE_OTHER): Payer: BC Managed Care – PPO | Admitting: Gastroenterology

## 2023-06-04 ENCOUNTER — Encounter: Payer: Self-pay | Admitting: Gastroenterology

## 2023-06-04 VITALS — BP 113/78 | HR 59 | Temp 98.3°F | Ht 63.0 in | Wt 294.4 lb

## 2023-06-04 DIAGNOSIS — K219 Gastro-esophageal reflux disease without esophagitis: Secondary | ICD-10-CM | POA: Diagnosis not present

## 2023-06-04 DIAGNOSIS — R1310 Dysphagia, unspecified: Secondary | ICD-10-CM | POA: Insufficient documentation

## 2023-06-04 NOTE — Progress Notes (Signed)
Gastroenterology Office Note    Referring Provider: Wilmon Pali, FNP Primary Care Physician:  Wilmon Pali, FNP  Primary GI: Dr. Marletta Lor    Chief Complaint   Chief Complaint  Patient presents with   New Patient (Initial Visit)    Pt referred for GERD     History of Present Illness   Cheyenne Wolfe is a 37 y.o. female presenting today at the request of [name], FNP, due to GERD.   Notes flaring of GERD moreso recently. Hard to lay flat. The other day was painful from epigastric region to mid esophagus. When swallowing felt like food was stuck. Would regurgitate and burp. When eating feels like she has to drink water to get food down. Wakes up and vomits in morning from acid. Has acid coming up in esophagus. PCP had sent in pantoprazole to start. Hasn't started yet.   Aleve BID: has slipped disc in back and ovarian cyst. Taking more Ibuprofen recently. 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 3 children, ages 45, 61, 6. Corliss Parish has special needs.     Past Medical History:  Diagnosis Date   Ankle fracture 04/2017   left   Anxiety    Bipolar 1 disorder (HCC)    History of asthma    no current med.   History of seizures    no seizures in > 10 yr. - no current med.   Migraines    Obesity     Past Surgical History:  Procedure Laterality Date   BUNIONECTOMY Bilateral    ORIF ANKLE FRACTURE Left 05/08/2017   Procedure: OPEN REDUCTION INTERNAL FIXATION (ORIF) LEFT ANKLE FRACTURE WITH SYNDESMOSIS;  Surgeon: Frederico Hamman, MD;  Location: [facility];  Service: Orthopedics;  Laterality: Left;   SYNDESMOSIS REPAIR  05/08/2017   Procedure: SYNDESMOSIS REPAIR;  Surgeon: Frederico Hamman, MD;  Location: [facility];  Service: Orthopedics;;    Current Outpatient Medications  Medication Sig Dispense Refill    amitriptyline (ELAVIL) 10 MG tablet Take 10 mg by mouth at bedtime.     ibuprofen (ADVIL) 200 MG tablet Take by mouth.     naproxen (NAPROSYN) 500 MG tablet Take 1 tablet (500 mg total) by mouth 2 (two) times daily. 30 tablet 0   topiramate (TOPAMAX) 50 MG tablet Take 150 mg by mouth daily.     No current facility-administered medications for this visit.    Allergies as of 06/04/2023 - Review Complete 06/04/2023  Allergen Reaction Noted   Oxycodone Nausea And Vomiting 06/04/2023    Family History  Problem Relation Age of Onset   Hypertension Mother    Diabetes Mother    Colon polyps Mother        possible   Schizophrenia Father    Asthma Brother    Hypertension Other    Cancer Other     Social History   Socioeconomic History   Marital status: Married    Spouse name: Not on file   Number of children: Not on file   Years of education: Not on file   Highest education level: Not on file  Occupational History   Not on file  Tobacco Use   Smoking status: Never   Smokeless tobacco: Never  Vaping Use   Vaping status: Never Used  Substance and Sexual Activity   Alcohol use: No   Drug  use: No   Sexual activity: Yes    Birth control/protection: Condom  Other Topics Concern   Not on file  Social History Narrative   Not on file   Social Determinants of Health   Financial Resource Strain: Not on file  Food Insecurity: Not on file  Transportation Needs: Not on file  Physical Activity: Not on file  Stress: Not on file  Social Connections: Not on file  Intimate Partner Violence: Not on file     Review of Systems   Gen: Denies any fever, chills, fatigue, weight loss, lack of appetite.  CV: Denies chest pain, heart palpitations, peripheral edema, syncope.  Resp: Denies shortness of breath at rest or with exertion. Denies wheezing or cough.  GI: see HPI GU : Denies urinary burning, urinary frequency, urinary hesitancy MS: Denies joint pain, muscle weakness, cramps, or  limitation of movement.  Derm: Denies rash, itching, dry skin Psych: Denies depression, anxiety, memory loss, and confusion Heme: Denies bruising, bleeding, and enlarged lymph nodes.   Physical Exam   BP 113/78   Pulse (!) 59   Temp 98.3 F (36.8 C)   Ht 5\' 3"  (1.6 m)   Wt 294 lb 6.4 oz (133.5 kg)   LMP 05/30/2023   BMI 52.15 kg/m  General:   Alert and oriented. Pleasant and cooperative. Well-nourished and well-developed.  Head:  Normocephalic and atraumatic. Eyes:  Without icterus Ears:  Normal auditory acuity. Lungs:  Clear to auscultation bilaterally.  Heart:  S1, S2 present without murmurs appreciated.  Abdomen:  +BS, soft, non-tender and non-distended. No HSM noted. No guarding or rebound. No masses appreciated.  Rectal:  Deferred  Msk:  Symmetrical without gross deformities. Normal posture. Extremities:  Without edema. Neurologic:  Alert and  oriented x4;  grossly normal neurologically. Skin:  Intact without significant lesions or rashes. Psych:  Alert and cooperative. Normal mood and affect.   Assessment  37 y.o. female presenting today at the request of [name], FNP, due to GERD.   GERD symptoms exacerbated and would benefit greatly from dietary/behavior modifications; patient is well aware of this. She also was instructed to start on pantoprazole daily, which her PCP had sent in for her previously.  Dysphagia likely related to uncontrolled GERD, possible stricture, web, ring. Will pursue EGD with dilation in near future.   In interim, discussed changing diet/behavior, adding PPI, limit/avoid NSAIDs.     PLAN   Proceed with upper endoscopy/dilation by Dr. Marletta Lor in near future: the risks, benefits, and alternatives have been discussed with the patient in detail. The patient states understanding and desires to proceed. ASA 3  Start pantoprazole daily  GERD diet provided  Return in 2 months   [name], PhD, [facility]

## 2023-06-04 NOTE — Patient Instructions (Signed)
Please start taking pantoprazole once daily, 30 minutes before eating. It is best absorbed on an empty stomach.  We are arranging an upper endoscopy with dilation by Dr. Marletta Lor in the near future!  We will see you in follow-up thereafter! Please see the attached handout regarding reflux and ways to limit flares.   It was a pleasure to see you today. I want to create trusting relationships with patients and provide genuine, compassionate, and quality care. I truly value your feedback, so please be on the lookout for a survey regarding your visit with me today. I appreciate your time in completing this!         [name], PhD, ANP-BC [facility]

## 2023-06-04 NOTE — H&P (View-Only) (Signed)
Gastroenterology Office Note    Referring Provider: Wilmon Pali, FNP Primary Care Physician:  Wilmon Pali, FNP  Primary GI: Dr. Marletta Lor    Chief Complaint   Chief Complaint  Patient presents with   New Patient (Initial Visit)    Pt referred for GERD     History of Present Illness   Cheyenne Wolfe is a 37 y.o. female presenting today at the request of [name], FNP, due to GERD.   Notes flaring of GERD moreso recently. Hard to lay flat. The other day was painful from epigastric region to mid esophagus. When swallowing felt like food was stuck. Would regurgitate and burp. When eating feels like she has to drink water to get food down. Wakes up and vomits in morning from acid. Has acid coming up in esophagus. PCP had sent in pantoprazole to start. Hasn't started yet.   Aleve BID: has slipped disc in back and ovarian cyst. Taking more Ibuprofen recently. 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 3 children, ages 94, 79, 75. Corliss Parish has special needs.     Past Medical History:  Diagnosis Date   Ankle fracture 04/2017   left   Anxiety    Bipolar 1 disorder (HCC)    History of asthma    no current med.   History of seizures    no seizures in > 10 yr. - no current med.   Migraines    Obesity     Past Surgical History:  Procedure Laterality Date   BUNIONECTOMY Bilateral    ORIF ANKLE FRACTURE Left 05/08/2017   Procedure: OPEN REDUCTION INTERNAL FIXATION (ORIF) LEFT ANKLE FRACTURE WITH SYNDESMOSIS;  Surgeon: Frederico Hamman, MD;  Location: [facility];  Service: Orthopedics;  Laterality: Left;   SYNDESMOSIS REPAIR  05/08/2017   Procedure: SYNDESMOSIS REPAIR;  Surgeon: Frederico Hamman, MD;  Location: [facility];  Service: Orthopedics;;    Current Outpatient Medications  Medication Sig Dispense Refill    amitriptyline (ELAVIL) 10 MG tablet Take 10 mg by mouth at bedtime.     ibuprofen (ADVIL) 200 MG tablet Take by mouth.     naproxen (NAPROSYN) 500 MG tablet Take 1 tablet (500 mg total) by mouth 2 (two) times daily. 30 tablet 0   topiramate (TOPAMAX) 50 MG tablet Take 150 mg by mouth daily.     No current facility-administered medications for this visit.    Allergies as of 06/04/2023 - Review Complete 06/04/2023  Allergen Reaction Noted   Oxycodone Nausea And Vomiting 06/04/2023    Family History  Problem Relation Age of Onset   Hypertension Mother    Diabetes Mother    Colon polyps Mother        possible   Schizophrenia Father    Asthma Brother    Hypertension Other    Cancer Other     Social History   Socioeconomic History   Marital status: Married    Spouse name: Not on file   Number of children: Not on file   Years of education: Not on file   Highest education level: Not on file  Occupational History   Not on file  Tobacco Use   Smoking status: Never   Smokeless tobacco: Never  Vaping Use   Vaping status: Never Used  Substance and Sexual Activity   Alcohol use: No   Drug  use: No   Sexual activity: Yes    Birth control/protection: Condom  Other Topics Concern   Not on file  Social History Narrative   Not on file   Social Determinants of Health   Financial Resource Strain: Not on file  Food Insecurity: Not on file  Transportation Needs: Not on file  Physical Activity: Not on file  Stress: Not on file  Social Connections: Not on file  Intimate Partner Violence: Not on file     Review of Systems   Gen: Denies any fever, chills, fatigue, weight loss, lack of appetite.  CV: Denies chest pain, heart palpitations, peripheral edema, syncope.  Resp: Denies shortness of breath at rest or with exertion. Denies wheezing or cough.  GI: see HPI GU : Denies urinary burning, urinary frequency, urinary hesitancy MS: Denies joint pain, muscle weakness, cramps, or  limitation of movement.  Derm: Denies rash, itching, dry skin Psych: Denies depression, anxiety, memory loss, and confusion Heme: Denies bruising, bleeding, and enlarged lymph nodes.   Physical Exam   BP 113/78   Pulse (!) 59   Temp 98.3 F (36.8 C)   Ht 5\' 3"  (1.6 m)   Wt 294 lb 6.4 oz (133.5 kg)   LMP 05/30/2023   BMI 52.15 kg/m  General:   Alert and oriented. Pleasant and cooperative. Well-nourished and well-developed.  Head:  Normocephalic and atraumatic. Eyes:  Without icterus Ears:  Normal auditory acuity. Lungs:  Clear to auscultation bilaterally.  Heart:  S1, S2 present without murmurs appreciated.  Abdomen:  +BS, soft, non-tender and non-distended. No HSM noted. No guarding or rebound. No masses appreciated.  Rectal:  Deferred  Msk:  Symmetrical without gross deformities. Normal posture. Extremities:  Without edema. Neurologic:  Alert and  oriented x4;  grossly normal neurologically. Skin:  Intact without significant lesions or rashes. Psych:  Alert and cooperative. Normal mood and affect.   Assessment  37 y.o. female presenting today at the request of [name], FNP, due to GERD.   GERD symptoms exacerbated and would benefit greatly from dietary/behavior modifications; patient is well aware of this. She also was instructed to start on pantoprazole daily, which her PCP had sent in for her previously.  Dysphagia likely related to uncontrolled GERD, possible stricture, web, ring. Will pursue EGD with dilation in near future.   In interim, discussed changing diet/behavior, adding PPI, limit/avoid NSAIDs.     PLAN   Proceed with upper endoscopy/dilation by Dr. Marletta Lor in near future: the risks, benefits, and alternatives have been discussed with the patient in detail. The patient states understanding and desires to proceed. ASA 3  Start pantoprazole daily  GERD diet provided  Return in 2 months   [name], PhD, [facility] Gastroenterology

## 2023-06-05 ENCOUNTER — Telehealth: Payer: Self-pay | Admitting: *Deleted

## 2023-06-05 NOTE — Telephone Encounter (Signed)
Mychart message sent to call to schedule EGD/DIL with Dr. Marletta Lor ASA 3

## 2023-06-07 NOTE — Progress Notes (Unsigned)
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  Stop ibuprofen and Excedrin Migraine Limit use of pain relievers to no more than 2 days out of week to prevent risk of rebound or medication-overuse headache. Keep headache diary Follow up 6 months.    Subjective:  Cheyenne Wolfe is a 37 year old right-handed female with Bipolar 1 disorder, anxiety, migraines and history of juvenile myoclonic epilepsy who presents for migraines.  History supplemented by referring provider's note.  Onset:  37-22 years old, worse over past 2-3 months. Location:  forehead and temples, occasionally diffuse Quality:  throbbing Intensity:  5/10.   Aura:  sees "bubbles" Prodrome:  upset stomach Associated symptoms:  Nausea, vomiting, photophobia, phonophobia, osmophobia, irritable.  She denies associated unilateral numbness or weakness. Duration:  1-2 days Frequency:  initially 1-2 a month, now 3 to 4 days a week Frequency of abortive medication: ibuprofen or Excedrin Migraine almost daily Triggers:  Stress, smells (cleaning products) Relieving factors:  cold or warm compress on head, sleep Activity:  movement aggravates  Diagnosed with Juvenile Myoclonic Epilepsy at age 37-99 years old.  She would have morning upper body jerking.  She had generalized tonic clonic seizures in 2005 and 2006 (both occurred in setting of off medication).  Treated with topiramate (200mg  daily) but now on 100mg  for past 5 years for migraines.  Subsequent EEGs have been reportedly  normal.  Other past medications:  Depakote (weight gain).  Paternal great grandmother had seizures.  Her son is currently being evaluated for absence seizures.  08/08/2022 CT HEAD:  Negative  Past NSAIDS/analgesics:  none Past abortive triptans:  sumatriptan tab Past abortive ergotamine:  none Past muscle relaxants:  none Past anti-emetic:  promethazine Past antihypertensive medications:  none Past antidepressant medications:  venlafaxine Past anticonvulsant medications:  lamotrigine Past anti-CGRP:  none   Current NSAIDS/analgesics:  ibuprofen, Excedrin Migraine, naproxen (sciatica) Current triptans:  none Current ergotamine:  none Current anti-emetic:  none Current muscle relaxants:  none Current Antihypertensive medications:  none Current Antidepressant medications:  amitriptyline 10mg  at bedtime (depression) Current Anticonvulsant medications:  topiramate 100mg  daily Current anti-CGRP:  none Other therapy:  none Birth control:  none   Caffeine:  coffee and energy drinks most days, soda daily Alcohol:  no Smoker:  no Diet:  at least 2-3 bottles of water daily.  Skips meals (no time) Depression:  yes; Anxiety:  yes Sleep hygiene:  4 hours of sleep. Mother of 3.  Works at [employer].  Goes to [employer] for school.   Family history of migraines:  unknown      PAST MEDICAL HISTORY: Past Medical History:  Diagnosis Date   Ankle fracture 04/2017   left   Anxiety    Bipolar 1 disorder (HCC)    History of asthma    no current med.   History of seizures    no seizures in > 10 yr. - no current med.   Migraines    Obesity     PAST SURGICAL HISTORY: Past Surgical History:  Procedure Laterality Date   BUNIONECTOMY Bilateral    ORIF ANKLE FRACTURE  Left 05/08/2017   Procedure: OPEN REDUCTION INTERNAL FIXATION (ORIF) LEFT ANKLE FRACTURE WITH SYNDESMOSIS;  Surgeon: Frederico Hamman, MD;  Location: [facility];  Service: Orthopedics;  Laterality: Left;   SYNDESMOSIS  REPAIR  05/08/2017   Procedure: SYNDESMOSIS REPAIR;  Surgeon: Frederico Hamman, MD;  [facility];  Service: Orthopedics;;    MEDICATIONS: Current Outpatient Medications on File Prior to Visit  Medication Sig Dispense Refill   amitriptyline (ELAVIL) 10 MG tablet Take 10 mg by mouth at bedtime.     ibuprofen (ADVIL) 200 MG tablet Take by mouth.     naproxen (NAPROSYN) 500 MG tablet Take 1 tablet (500 mg total) by mouth 2 (two) times daily. 30 tablet 0   pantoprazole (PROTONIX) 40 MG tablet Take 40 mg by mouth daily.     topiramate (TOPAMAX) 50 MG tablet Take 150 mg by mouth daily.     No current facility-administered medications on file prior to visit.    ALLERGIES: Allergies  Allergen Reactions   Oxycodone Nausea And Vomiting    FAMILY HISTORY: Family History  Problem Relation Age of Onset   Hypertension Mother    Diabetes Mother    Colon polyps Mother        possible   Schizophrenia Father    Asthma Brother    Hypertension Other    Cancer Other     Objective:  Blood pressure 116/79, pulse 67, height 5\' 3"  (1.6 m), weight 297 lb (134.7 kg), last menstrual period 05/30/2023, SpO2 100%. General: No acute distress.  Patient appears well-groomed.   Head:  Normocephalic/atraumatic Eyes:  fundi examined but not visualized Neck: supple, no paraspinal tenderness, full range of motion Back: No paraspinal tenderness Heart: regular rate and rhythm Lungs: Clear to auscultation bilaterally. Vascular: No carotid bruits. Neurological Exam: Mental status: alert and oriented to person, place, and time, speech fluent and not dysarthric, language intact. Cranial nerves: CN I: not tested CN II: pupils equal, round and reactive to light, visual fields intact CN III, IV, VI:  full range of motion, no nystagmus, no ptosis CN V: facial sensation intact. CN VII: upper and lower face symmetric CN VIII: hearing intact CN IX, X: gag intact, uvula midline CN XI:  sternocleidomastoid and trapezius muscles intact CN XII: tongue midline Bulk & Tone: normal, no fasciculations. Motor:  muscle strength 5/5 throughout Sensation:  Pinprick, temperature and vibratory sensation intact. Deep Tendon Reflexes:  2+ throughout,  toes downgoing.   Finger to nose testing:  Without dysmetria.   Heel to shin:  Without dysmetria.   Gait:  Normal station and stride.  Romberg negative.    Thank you for allowing me to take part in the care of this patient.  Shon Millet, DO  CC: [name], FNP

## 2023-06-10 ENCOUNTER — Encounter: Payer: Self-pay | Admitting: *Deleted

## 2023-06-10 ENCOUNTER — Ambulatory Visit (INDEPENDENT_AMBULATORY_CARE_PROVIDER_SITE_OTHER): Payer: BC Managed Care – PPO | Admitting: Neurology

## 2023-06-10 ENCOUNTER — Encounter: Payer: Self-pay | Admitting: Neurology

## 2023-06-10 ENCOUNTER — Telehealth: Payer: Self-pay | Admitting: Neurology

## 2023-06-10 VITALS — BP 116/79 | HR 67 | Ht 63.0 in | Wt 297.0 lb

## 2023-06-10 DIAGNOSIS — G43009 Migraine without aura, not intractable, without status migrainosus: Secondary | ICD-10-CM

## 2023-06-10 DIAGNOSIS — G40B09 Juvenile myoclonic epilepsy, not intractable, without status epilepticus: Secondary | ICD-10-CM

## 2023-06-10 MED ORDER — EMGALITY 120 MG/ML ~~LOC~~ SOAJ: 240.0000 mg | Freq: Once | SUBCUTANEOUS | 0 refills | Status: AC

## 2023-06-10 MED ORDER — RIZATRIPTAN BENZOATE 10 MG PO TBDP: 10.0000 mg | ORAL_TABLET | ORAL | 5 refills | Status: DC | PRN

## 2023-06-10 MED ORDER — ONDANSETRON 4 MG PO TBDP: 4.0000 mg | ORAL_TABLET | Freq: Three times a day (TID) | ORAL | 5 refills | Status: DC | PRN

## 2023-06-10 NOTE — Telephone Encounter (Signed)
1. Which medications need to be refilled? (please list name of each medication and dose if known) Galcanezumab-gnlm (EMGALITY) 120 MG/ML SOAJ [[id number]]   2. Which pharmacy/location (including street and city if local pharmacy) is medication to be sent to? [facility] - [place], [place]  3. Do they need a 30 day or 90 day supply?  Caller stated pharmacy needs PA.  INS Member ID: [id number]

## 2023-06-10 NOTE — Patient Instructions (Signed)
  Start Emgality injection - 2 injections for first dose, then 1 injection every 28 days thereafter.  When you pick up the first dose (2 pens), contact me and I will send prescription for standing order Topiramate 100mg  at bedtime STOP IBUPROFEN AND EXCEDRIN.  Take Rizatriptan at earliest onset of headache.  May repeat dose once in 2 hours if needed.  Maximum 2 tablets in 24 hours. Take ondansetron for nausea Limit use of pain relievers to no more than 2 days out of the week.  These medications include acetaminophen, NSAIDs (ibuprofen/Advil/Motrin, naproxen/Aleve, triptans (Imitrex/sumatriptan), Excedrin, and narcotics.  This will help reduce risk of rebound headaches. Be aware of common food triggers Routine exercise Stay adequately hydrated (aim for 64 oz water daily) Keep headache diary Maintain proper stress management Maintain proper sleep hygiene Do not skip meals Consider supplements:  magnesium citrate 400mg  daily, riboflavin 400mg  daily, coenzyme Q10 300mg  daily.

## 2023-06-24 ENCOUNTER — Telehealth: Payer: Self-pay | Admitting: Neurology

## 2023-06-24 NOTE — Telephone Encounter (Signed)
Advised patient will check on PA and let her know.

## 2023-06-24 NOTE — Telephone Encounter (Signed)
Patient called stating that Cheyenne Wolfe's pharamcy is still not letting her get the emgality inj. Patient isnt sure if she needs a Pa or  not

## 2023-06-27 ENCOUNTER — Telehealth: Payer: Self-pay

## 2023-06-27 NOTE — Telephone Encounter (Signed)
*  [facility]  Pharmacy Patient Advocate Encounter   Received notification from Pt Calls Messages that prior authorization for Emgality 120MG /ML auto-injectors (migraine)  is required/requested.   Insurance verification completed.   The patient is insured through [facility] .   Per test claim: PA required; PA submitted to [facility] via CoverMyMeds Key/confirmation #/EOC [id number] Status is pending

## 2023-06-27 NOTE — Telephone Encounter (Signed)
PA request has been Submitted. New Encounter created for follow up. For additional info see Pharmacy Prior Auth telephone encounter from 09/12.

## 2023-06-28 ENCOUNTER — Encounter (HOSPITAL_COMMUNITY): Payer: Self-pay

## 2023-06-28 ENCOUNTER — Encounter (HOSPITAL_COMMUNITY)
Admission: RE | Admit: 2023-06-28 | Discharge: 2023-06-28 | Disposition: A | Discharge status: Home / Self Care | Payer: BC Managed Care – PPO | Source: Ambulatory Visit | Attending: Internal Medicine | Admitting: Internal Medicine

## 2023-06-28 ENCOUNTER — Encounter (HOSPITAL_COMMUNITY): Payer: BC Managed Care – PPO

## 2023-06-28 VITALS — BP 116/79 | HR 67 | Temp 97.8°F | Resp 18 | Ht 63.0 in | Wt 297.0 lb

## 2023-06-28 DIAGNOSIS — K2950 Unspecified chronic gastritis without bleeding: Secondary | ICD-10-CM | POA: Diagnosis not present

## 2023-06-28 DIAGNOSIS — Z01818 Encounter for other preprocedural examination: Secondary | ICD-10-CM | POA: Insufficient documentation

## 2023-06-28 DIAGNOSIS — K2100 Gastro-esophageal reflux disease with esophagitis, without bleeding: Secondary | ICD-10-CM | POA: Insufficient documentation

## 2023-06-28 DIAGNOSIS — Z79899 Other long term (current) drug therapy: Secondary | ICD-10-CM | POA: Diagnosis not present

## 2023-06-28 DIAGNOSIS — R1310 Dysphagia, unspecified: Secondary | ICD-10-CM | POA: Diagnosis not present

## 2023-06-28 DIAGNOSIS — K219 Gastro-esophageal reflux disease without esophagitis: Secondary | ICD-10-CM | POA: Diagnosis not present

## 2023-06-28 DIAGNOSIS — N83209 Unspecified ovarian cyst, unspecified side: Secondary | ICD-10-CM | POA: Diagnosis not present

## 2023-06-28 DIAGNOSIS — Z791 Long term (current) use of non-steroidal anti-inflammatories (NSAID): Secondary | ICD-10-CM | POA: Diagnosis not present

## 2023-06-28 HISTORY — DX: Unspecified asthma, uncomplicated: J45.909

## 2023-06-28 LAB — POCT PREGNANCY, URINE: Preg Test, Ur: NEGATIVE

## 2023-06-28 NOTE — Patient Instructions (Signed)
Cheyenne Wolfe [facility]  06/28/2023     @PREFPERIOPPHARMACY @   Your procedure is scheduled on 07/01/23.  Report to Chey[facility] Wolfe at 1215 P.M.  Call this number if you have problems the morning of surgery:  [phone]  If you experience any cold or flu symptoms such as cough, fever, chills, shortness of breath, etc. between now and your scheduled surgery, please notify us at the above number.   Remember:  Do not eat or drink after midnight.    Take these medicines the morning of surgery with A SIP OF WATER ZOFRAN IF NEEDED, PROTONIX, MAXALT & TOPAMAX    Do not wear jewelry, make-up or nail polish, including gel polish,  artificial nails, or any other type of covering on natural nails (fingers and  toes).  Do not wear lotions, powders, or perfumes, or deodorant.  Do not shave 48 hours prior to surgery.  Men may shave face and neck.  Do not bring valuables to the hospital.  [facility] is not responsible for any belongings or valuables.  Contacts, dentures or bridgework may not be worn into surgery.  Leave your suitcase in the car.  After surgery it may be brought to your room.  For patients admitted to the hospital, discharge time will be determined by your treatment team.  Patients discharged the day of surgery will not be allowed to drive home.   Name and phone number of your driver:   FAMILY Special instructions:  FOLLOW DIET & PREP INSTRUCTIONS PROVIDED BY OFFICE  Please read over the following fact sheets that you were given. Anesthesia Post-op Instructions and Care and Recovery After Surgery      PATIENT INSTRUCTIONS POST-ANESTHESIA  IMMEDIATELY FOLLOWING SURGERY:  Do not drive or operate machinery for the first twenty four hours after surgery.  Do not make any important decisions for twenty four hours after surgery or while taking narcotic pain medications or sedatives.  If you develop intractable nausea and vomiting or a severe headache please notify your doctor  immediately.  FOLLOW-UP:  Please make an appointment with your surgeon as instructed. You do not need to follow up with anesthesia unless specifically instructed to do so.  WOUND CARE INSTRUCTIONS (if applicable):  Keep a dry clean dressing on the anesthesia/puncture wound site if there is drainage.  Once the wound has quit draining you may leave it open to air.  Generally you should leave the bandage intact for twenty four hours unless there is drainage.  If the epidural site drains for more than 36-48 hours please call the anesthesia department.  QUESTIONS?:  Please feel free to call your physician or the hospital operator if you have any questions, and they will be happy to assist you.      Esophageal Dilatation Esophageal dilatation, also called esophageal dilation, is a procedure to widen or open a blocked or narrowed part of the esophagus. The esophagus is the part of the body that moves food and liquid from the mouth to the stomach. You may need this procedure if: You have a buildup of scar tissue in your esophagus that makes it difficult, painful, or impossible to swallow. This can be caused by gastroesophageal reflux disease (GERD). You have cancer of the esophagus. There is a problem with how food moves through your esophagus. In some cases, you may need this procedure repeated at a later time to dilate the esophagus gradually. Tell a health care provider about: Any allergies you have. All medicines you are  taking, including vitamins, herbs, eye drops, creams, and over-the-counter medicines. Any problems you or family members have had with anesthetic medicines. Any blood disorders you have. Any surgeries you have had. Any medical conditions you have. Any antibiotic medicines you are required to take before dental procedures. Whether you are pregnant or may be pregnant. What are the risks? Generally, this is a safe procedure. However, problems may occur, including: Bleeding due to  a tear in the lining of the esophagus. A hole, or perforation, in the esophagus. What happens before the procedure? Ask your health care provider about: Changing or stopping your regular medicines. This is especially important if you are taking diabetes medicines or blood thinners. Taking medicines such as aspirin and ibuprofen. These medicines can thin your blood. Do not take these medicines unless your health care provider tells you to take them. Taking over-the-counter medicines, vitamins, herbs, and supplements. Follow instructions from your health care provider about eating or drinking restrictions. Plan to have a responsible adult take you home from the hospital or clinic. Plan to have a responsible adult care for you for the time you are told after you leave the hospital or clinic. This is important. What happens during the procedure? You may be given a medicine to help you relax (sedative). A numbing medicine may be sprayed into the back of your throat, or you may gargle the medicine. Your health care provider may perform the dilatation using various surgical instruments, such as: Simple dilators. This instrument is carefully placed in the esophagus to stretch it. Guided wire bougies. This involves using an endoscope to insert a wire into the esophagus. A dilator is passed over this wire to enlarge the esophagus. Then the wire is removed. Balloon dilators. An endoscope with a small balloon is inserted into the esophagus. The balloon is inflated to stretch the esophagus and open it up. The procedure may vary among health care providers and hospitals. What can I expect after the procedure? Your blood pressure, heart rate, breathing rate, and blood oxygen level will be monitored until you leave the hospital or clinic. Your throat may feel slightly sore and numb. This will get better over time. You will not be allowed to eat or drink until your throat is no longer numb. When you are able to  drink, urinate, and sit on the edge of the bed without nausea or dizziness, you may be able to return home. Follow these instructions at home: Take over-the-counter and prescription medicines only as told by your health care provider. If you were given a sedative during the procedure, it can affect you for several hours. Do not drive or operate machinery until your health care provider says that it is safe. Plan to have a responsible adult care for you for the time you are told. This is important. Follow instructions from your health care provider about any eating or drinking restrictions. Do not use any products that contain nicotine or tobacco, such as cigarettes, e-cigarettes, and chewing tobacco. If you need help quitting, ask your health care provider. Keep all follow-up visits. This is important. Contact a health care provider if: You have a fever. You have pain that is not relieved by medicine. Get help right away if: You have chest pain. You have trouble breathing. You have trouble swallowing. You vomit blood. You have black, tarry, or bloody stools. These symptoms may represent a serious problem that is an emergency. Do not wait to see if the symptoms will go away.  Get medical help right away. Call your local emergency services (911 in the [place] Do not drive yourself to the hospital. Summary Esophageal dilatation, also called esophageal dilation, is a procedure to widen or open a blocked or narrowed part of the esophagus. Plan to have a responsible adult take you home from the hospital or clinic. For this procedure, a numbing medicine may be sprayed into the back of your throat, or you may gargle the medicine. Do not drive or operate machinery until your health care provider says that it is safe. This information is not intended to replace advice given to you by your health care provider. Make sure you discuss any questions you have with your health care provider. Document Revised:  02/17/2020 Document Reviewed: 02/17/2020 Elsevier Patient Education  2024 [employer] Upper Endoscopy, Adult Upper endoscopy is a procedure to look inside the upper GI (gastrointestinal) tract. The upper GI tract is made up of: The esophagus. This is the part of the body that moves food from your mouth to your stomach. The stomach. The duodenum. This is the first part of your small intestine. This procedure is also called esophagogastroduodenoscopy (EGD) or gastroscopy. In this procedure, your health care provider passes a thin, flexible tube (endoscope) through your mouth and down your esophagus into your stomach and into your duodenum. A small camera is attached to the end of the tube. Images from the camera appear on a monitor in the exam room. During this procedure, your health care provider may also remove a small piece of tissue to be sent to a lab and examined under a microscope (biopsy). Your health care provider may do an upper endoscopy to diagnose cancers of the upper GI tract. You may also have this procedure to find the cause of other conditions, such as: Stomach pain. Heartburn. Pain or problems when swallowing. Nausea and vomiting. Stomach bleeding. Stomach ulcers. Tell a health care provider about: Any allergies you have. All medicines you are taking, including vitamins, herbs, eye drops, creams, and over-the-counter medicines. Any problems you or family members have had with anesthetic medicines. Any bleeding problems you have. Any surgeries you have had. Any medical conditions you have. Whether you are pregnant or may be pregnant. What are the risks? Your healthcare provider will talk with you about risks. These may include: Infection. Bleeding. Allergic reactions to medicines. A tear or hole (perforation) in the esophagus, stomach, or duodenum. What happens before the procedure? When to stop eating and drinking Follow instructions from your health care provider  about what you may eat and drink. These may include: 8 hours before your procedure Stop eating most foods. Do not eat meat, fried foods, or fatty foods. Eat only light foods, such as toast or crackers. All liquids are okay except energy drinks and alcohol. 6 hours before your procedure Stop eating. Drink only clear liquids, such as water, clear fruit juice, black coffee, plain tea, and sports drinks. Do not drink energy drinks or alcohol. 2 hours before your procedure Stop drinking all liquids. You may be allowed to take medicines with small sips of water. If you do not follow your health care provider's instructions, your procedure may be delayed or canceled. Medicines Ask your health care provider about: Changing or stopping your regular medicines. This is especially important if you are taking diabetes medicines or blood thinners. Taking medicines such as aspirin and ibuprofen. These medicines can thin your blood. Do not take these medicines unless your health care provider tells  you to take them. Taking over-the-counter medicines, vitamins, herbs, and supplements. General instructions If you will be going home right after the procedure, plan to have a responsible adult: Take you home from the hospital or clinic. You will not be allowed to drive. Care for you for the time you are told. What happens during the procedure?  An IV will be inserted into one of your veins. You may be given one or more of the following: A medicine to help you relax (sedative). A medicine to numb the throat (local anesthetic). You will lie on your left side on an exam table. Your health care provider will pass the endoscope through your mouth and down your esophagus. Your health care provider will use the scope to check the inside of your esophagus, stomach, and duodenum. Biopsies may be taken. The endoscope will be removed. The procedure may vary among health care providers and hospitals. What happens  after the procedure? Your blood pressure, heart rate, breathing rate, and blood oxygen level will be monitored until you leave the hospital or clinic. When your throat is no longer numb, you may be given some fluids to drink. If you were given a sedative during the procedure, it can affect you for several hours. Do not drive or operate machinery until your health care provider says that it is safe. It is up to you to get the results of your procedure. Ask your health care provider, or the department that is doing the procedure, when your results will be ready. Contact a health care provider if you: Have a sore throat that lasts longer than 1 day. Have a fever. Get help right away if you: Vomit blood or your vomit looks like coffee grounds. Have bloody, black, or tarry stools. Have a very bad sore throat or you cannot swallow. Have difficulty breathing or very bad pain in your chest or abdomen. These symptoms may be an emergency. Get help right away. Call 911. Do not wait to see if the symptoms will go away. Do not drive yourself to the hospital. Summary Upper endoscopy is a procedure to look inside the upper GI tract. During the procedure, an IV will be inserted into one of your veins. You may be given a medicine to help you relax. The endoscope will be passed through your mouth and down your esophagus. Follow instructions from your health care provider about what you can eat and drink. This information is not intended to replace advice given to you by your health care provider. Make sure you discuss any questions you have with your health care provider. Document Revised: 01/10/2022 Document Reviewed: 01/10/2022 Elsevier Patient Education  2024 Elsevier Inc. Upper Endoscopy, Adult, Care After After the procedure, it is common to have a sore throat. It is also common to have: Mild stomach pain or discomfort. Bloating. Nausea. Follow these instructions at home: The instructions below may  help you care for yourself at home. Your health care provider may give you more instructions. If you have questions, ask your health care provider. If you were given a sedative during the procedure, it can affect you for several hours. Do not drive or operate machinery until your health care provider says that it is safe. If you will be going home right after the procedure, plan to have a responsible adult: Take you home from the hospital or clinic. You will not be allowed to drive. Care for you for the time you are told. Follow instructions from your health  care provider about what you may eat and drink. Return to your normal activities as told by your health care provider. Ask your health care provider what activities are safe for you. Take over-the-counter and prescription medicines only as told by your health care provider. Contact a health care provider if you: Have a sore throat that lasts longer than one day. Have trouble swallowing. Have a fever. Get help right away if you: Vomit blood or your vomit looks like coffee grounds. Have bloody, black, or tarry stools. Have a very bad sore throat or you cannot swallow. Have difficulty breathing or very bad pain in your chest or abdomen. These symptoms may be an emergency. Get help right away. Call 911. Do not wait to see if the symptoms will go away. Do not drive yourself to the hospital. Summary After the procedure, it is common to have a sore throat, mild stomach discomfort, bloating, and nausea. If you were given a sedative during the procedure, it can affect you for several hours. Do not drive until your health care provider says that it is safe. Follow instructions from your health care provider about what you may eat and drink. Return to your normal activities as told by your health care provider. This information is not intended to replace advice given to you by your health care provider. Make sure you discuss any questions you have  with your health care provider. Document Revised: 01/10/2022 Document Reviewed: 01/10/2022 Elsevier Patient Education  2024 [employer].

## 2023-07-01 ENCOUNTER — Encounter (HOSPITAL_COMMUNITY)
Admission: RE | Disposition: A | Discharge status: Home / Self Care | Payer: Self-pay | Source: Ambulatory Visit | Attending: Internal Medicine

## 2023-07-01 ENCOUNTER — Ambulatory Visit (HOSPITAL_COMMUNITY): Payer: BC Managed Care – PPO | Admitting: Anesthesiology

## 2023-07-01 ENCOUNTER — Ambulatory Visit (HOSPITAL_COMMUNITY)
Admission: RE | Admit: 2023-07-01 | Discharge: 2023-07-01 | Disposition: A | Discharge status: Home / Self Care | Payer: BC Managed Care – PPO | Source: Ambulatory Visit | Attending: Internal Medicine | Admitting: Internal Medicine

## 2023-07-01 ENCOUNTER — Encounter (HOSPITAL_COMMUNITY): Payer: Self-pay

## 2023-07-01 DIAGNOSIS — Z79899 Other long term (current) drug therapy: Secondary | ICD-10-CM | POA: Insufficient documentation

## 2023-07-01 DIAGNOSIS — K219 Gastro-esophageal reflux disease without esophagitis: Secondary | ICD-10-CM | POA: Insufficient documentation

## 2023-07-01 DIAGNOSIS — N83209 Unspecified ovarian cyst, unspecified side: Secondary | ICD-10-CM | POA: Insufficient documentation

## 2023-07-01 DIAGNOSIS — R1310 Dysphagia, unspecified: Secondary | ICD-10-CM | POA: Insufficient documentation

## 2023-07-01 DIAGNOSIS — K2970 Gastritis, unspecified, without bleeding: Secondary | ICD-10-CM | POA: Diagnosis not present

## 2023-07-01 DIAGNOSIS — K2950 Unspecified chronic gastritis without bleeding: Secondary | ICD-10-CM | POA: Diagnosis not present

## 2023-07-01 DIAGNOSIS — Z791 Long term (current) use of non-steroidal anti-inflammatories (NSAID): Secondary | ICD-10-CM | POA: Insufficient documentation

## 2023-07-01 HISTORY — PX: BALLOON DILATION: SHX5330

## 2023-07-01 HISTORY — PX: ESOPHAGOGASTRODUODENOSCOPY (EGD) WITH PROPOFOL: SHX5813

## 2023-07-01 HISTORY — PX: BIOPSY: SHX5522

## 2023-07-01 SURGERY — ESOPHAGOGASTRODUODENOSCOPY (EGD) WITH PROPOFOL
Anesthesia: General

## 2023-07-01 MED ORDER — PROPOFOL 10 MG/ML IV BOLUS
INTRAVENOUS | Status: DC | PRN
  Administered 2023-07-01: 50 mg via INTRAVENOUS
  Administered 2023-07-01 (×2): 100 mg via INTRAVENOUS

## 2023-07-01 MED ORDER — LACTATED RINGERS IV SOLN: INTRAVENOUS | Status: DC

## 2023-07-01 MED ORDER — LIDOCAINE HCL (CARDIAC) PF 100 MG/5ML IV SOSY
PREFILLED_SYRINGE | INTRAVENOUS | Status: DC | PRN
  Administered 2023-07-01: 50 mg via INTRAVENOUS

## 2023-07-01 NOTE — Interval H&P Note (Signed)
History and Physical Interval Note:  07/01/2023 11:20 AM  Cheyenne Wolfe  has presented today for surgery, with the diagnosis of dysphagia,GERD.  The various methods of treatment have been discussed with the patient and family. After consideration of risks, benefits and other options for treatment, the patient has consented to  Procedure(s) with comments: ESOPHAGOGASTRODUODENOSCOPY (EGD) WITH PROPOFOL (N/A) - 2:15 pm, asa 3, no ans, no vm to move pt up BALLOON DILATION (N/A) as a surgical intervention.  The patient's history has been reviewed, patient examined, no change in status, stable for surgery.  I have reviewed the patient's chart and labs.  Questions were answered to the patient's satisfaction.     Lanelle Bal

## 2023-07-01 NOTE — Op Note (Signed)
[facility] Patient Name: Cheyenne Wolfe Procedure Date: 07/01/2023 11:29 AM MRN: [id number] Date of Birth: 02/20/1986 Attending MD: Hennie Duos. Marletta Lor , [place], [id number] CSN: [id number] Age: 37 Admit Type: Outpatient Procedure:                Upper GI endoscopy Indications:              Dysphagia, Heartburn Providers:                Hennie Duos. Marletta Lor, DO, Sheran Fava, Pandora Leiter, Technician Referring MD:              Medicines:                See the Anesthesia note for documentation of the                            administered medications Complications:            No immediate complications. Estimated Blood Loss:     Estimated blood loss was minimal. Procedure:                Pre-Anesthesia Assessment:                           - The anesthesia plan was to use monitored                            anesthesia care (MAC).                           After obtaining informed consent, the endoscope was                            passed under direct vision. Throughout the                            procedure, the patient's blood pressure, pulse, and                            oxygen saturations were monitored continuously. The                            GIF-H190 ([id number]) scope was introduced through the                            mouth, and advanced to the second part of duodenum.                            The upper GI endoscopy was accomplished without                            difficulty. The patient tolerated the procedure                            well. Scope In: 11:39:23 AM Scope  Out: 11:45:41 AM Total Procedure Duration: 0 hours 6 minutes 18 seconds  Findings:      The Z-line was regular and was found 36 cm from the incisors.      Biopsies were taken with a cold forceps in the middle third of the       esophagus for histology.      No endoscopic abnormality was evident in the esophagus to explain the       patient's complaint of  dysphagia. Preparations were made for empiric       dilation. A TTS dilator was passed through the scope. Dilation with an       18-19-20 mm balloon dilator was performed to 20 mm. Dilation was       performed with a mild resistance at 20 mm. Estimated blood loss was none.      Patchy mild inflammation characterized by erythema was found in the       gastric body. Biopsies were taken with a cold forceps for Helicobacter       pylori testing.      The duodenal bulb, first portion of the duodenum and second portion of       the duodenum were normal. Impression:               - Z-line regular, 36 cm from the incisors.                           - Gastritis. Biopsied.                           - Normal duodenal bulb, first portion of the                            duodenum and second portion of the duodenum.                           - Biopsies were taken with a cold forceps for                            histology in the middle third of the esophagus. Moderate Sedation:      Per Anesthesia Care Recommendation:           - Patient has a contact number available for                            emergencies. The signs and symptoms of potential                            delayed complications were discussed with the                            patient. Return to normal activities tomorrow.                            Written discharge instructions were provided to the                            patient.                           -  Resume previous diet.                           - Continue present medications.                           - Await pathology results.                           - Repeat upper endoscopy PRN for retreatment.                           - Return to GI clinic in 3 months.                           - Use Protonix (pantoprazole) 40 mg PO daily. Procedure Code(s):        --- Professional ---                           [phone], Esophagogastroduodenoscopy, flexible,                             transoral; with biopsy, single or multiple Diagnosis Code(s):        --- Professional ---                           K29.70, Gastritis, unspecified, without bleeding                           R13.10, Dysphagia, unspecified                           R12, Heartburn CPT copyright 2022 American Medical Association. All rights reserved. The codes documented in this report are preliminary and upon coder review may  be revised to meet current compliance requirements. Hennie Duos. Marletta Lor, DO Hennie Duos. Marletta Lor, DO 07/01/2023 11:48:08 AM This report has been signed electronically. Number of Addenda: 0

## 2023-07-01 NOTE — Transfer of Care (Signed)
Immediate Anesthesia Transfer of Care Note  Patient: Cheyenne Wolfe  Procedure(s) Performed: ESOPHAGOGASTRODUODENOSCOPY (EGD) WITH PROPOFOL BALLOON DILATION BIOPSY  Patient Location: Short Stay  Anesthesia Type:General  Level of Consciousness: drowsy  Airway & Oxygen Therapy: Patient Spontanous Breathing  Post-op Assessment: Report given to RN and Post -op Vital signs reviewed and stable  Post vital signs: Reviewed and stable  Last Vitals:  Vitals Value Taken Time  BP    Temp    Pulse    Resp    SpO2      Last Pain:  Vitals:   07/01/23 1135  PainSc: 0-No pain         Complications: No notable events documented.

## 2023-07-01 NOTE — Discharge Instructions (Signed)
EGD Discharge instructions Please read the instructions outlined below and refer to this sheet in the next few weeks. These discharge instructions provide you with general information on caring for yourself after you leave the hospital. Your doctor may also give you specific instructions. While your treatment has been planned according to the most current medical practices available, unavoidable complications occasionally occur. If you have any problems or questions after discharge, please call your doctor. ACTIVITY You may resume your regular activity but move at a slower pace for the next 24 hours.  Take frequent rest periods for the next 24 hours.  Walking will help expel (get rid of) the air and reduce the bloated feeling in your abdomen.  No driving for 24 hours (because of the anesthesia (medicine) used during the test).  You may shower.  Do not sign any important legal documents or operate any machinery for 24 hours (because of the anesthesia used during the test).  NUTRITION Drink plenty of fluids.  You may resume your normal diet.  Begin with a light meal and progress to your normal diet.  Avoid alcoholic beverages for 24 hours or as instructed by your caregiver.  MEDICATIONS You may resume your normal medications unless your caregiver tells you otherwise.  WHAT YOU CAN EXPECT TODAY You may experience abdominal discomfort such as a feeling of fullness or "gas" pains.  FOLLOW-UP Your doctor will discuss the results of your test with you.  SEEK IMMEDIATE MEDICAL ATTENTION IF ANY OF THE FOLLOWING OCCUR: Excessive nausea (feeling sick to your stomach) and/or vomiting.  Severe abdominal pain and distention (swelling).  Trouble swallowing.  Temperature over 101 F (37.8 C).  Rectal bleeding or vomiting of blood.   Your EGD revealed mild amount inflammation in your stomach.  I took biopsies of this to rule out infection with a bacteria called H. pylori.  Await pathology results, my  office will contact you.  I also took some samples of your esophagus as well.    Esophagus was mildly tight, I stretched it out today.  Hopefully this helps with feeling of food getting stuck.  Small bowel appeared normal.  Continue on pantoprazole daily.  Follow-up in GI office in 2 to 3 months.   I hope you have a great rest of your week!  Hennie Duos. Marletta Lor, D.O. Gastroenterology and Hepatology [facility]

## 2023-07-01 NOTE — Telephone Encounter (Signed)
Pharmacy Patient Advocate Encounter  Received notification from [facility] that Prior Authorization for [facility] 120MG  has been DENIED.  See denial reason below. No denial letter attached in CMM. Will attache denial letter to Media tab once received.   PA #/Case ID/Reference #: [id number]  Please be advised we currently do not have a Pharmacist to review denials, therefore you will need to process appeals accordingly as needed. Thanks for your support at this time. Contact for appeals are as follows: Phone: [phone], Fax: [phone]

## 2023-07-01 NOTE — Anesthesia Preprocedure Evaluation (Signed)
Anesthesia Evaluation  Patient identified by MRN, date of birth, ID band Patient awake    Reviewed: Allergy & Precautions, H&P , NPO status , Patient's Chart, lab work & pertinent test results, reviewed documented beta blocker date and time   Airway Mallampati: II  TM Distance: >3 FB Neck ROM: full    Dental no notable dental hx.    Pulmonary neg pulmonary ROS, asthma    Pulmonary exam normal breath sounds clear to auscultation       Cardiovascular Exercise Tolerance: Good negative cardio ROS  Rhythm:regular Rate:Normal     Neuro/Psych  Headaches PSYCHIATRIC DISORDERS Anxiety  Bipolar Disorder   negative neurological ROS  negative psych ROS   GI/Hepatic negative GI ROS, Neg liver ROS,GERD  ,,  Endo/Other  negative endocrine ROS    Renal/GU negative Renal ROS  negative genitourinary   Musculoskeletal   Abdominal   Peds  Hematology negative hematology ROS (+)   Anesthesia Other Findings   Reproductive/Obstetrics negative OB ROS                             Anesthesia Physical Anesthesia Plan  ASA: 2  Anesthesia Plan: General   Post-op Pain Management:    Induction:   PONV Risk Score and Plan: Propofol infusion  Airway Management Planned:   Additional Equipment:   Intra-op Plan:   Post-operative Plan:   Informed Consent: I have reviewed the patients History and Physical, chart, labs and discussed the procedure including the risks, benefits and alternatives for the proposed anesthesia with the patient or authorized representative who has indicated his/her understanding and acceptance.     Dental Advisory Given  Plan Discussed with: CRNA  Anesthesia Plan Comments:        Anesthesia Quick Evaluation

## 2023-07-01 NOTE — Anesthesia Procedure Notes (Signed)
Date/Time: 07/01/2023 11:41 AM  Performed by: Julian Reil, CRNAPre-anesthesia Checklist: Patient identified, Emergency Drugs available, Suction available and Patient being monitored Patient Re-evaluated:Patient Re-evaluated prior to induction Oxygen Delivery Method: Nasal cannula Induction Type: IV induction Placement Confirmation: positive ETCO2 Comments: Optiflow High Flow Flowella O2 used.

## 2023-07-03 ENCOUNTER — Telehealth: Payer: Self-pay | Admitting: Neurology

## 2023-07-03 NOTE — Telephone Encounter (Signed)
Patient called stating that [facility] isnt wanting to fill her Topirmate . She is saying that the Pharmacy is blaming the Doctor office on her not getting the medication

## 2023-07-03 NOTE — Telephone Encounter (Signed)
Called patient and she stated that she is currently taking topiramate 100 mg twice a day. I asked patient if she increased that herself and she stated "No, I guess I gave him the wrong information." Patient states that she has been taking topiramate on and off for 15-20 years and it hasn't been working.

## 2023-07-04 ENCOUNTER — Other Ambulatory Visit: Payer: Self-pay | Admitting: Neurology

## 2023-07-04 NOTE — Telephone Encounter (Signed)
Called patient and left a message for a call back

## 2023-07-06 NOTE — Anesthesia Postprocedure Evaluation (Signed)
Anesthesia Post Note  Patient: Cheyenne Wolfe  Procedure(s) Performed: ESOPHAGOGASTRODUODENOSCOPY (EGD) WITH PROPOFOL BALLOON DILATION BIOPSY  Patient location during evaluation: Phase II Anesthesia Type: General Level of consciousness: awake Pain management: pain level controlled Vital Signs Assessment: post-procedure vital signs reviewed and stable Respiratory status: spontaneous breathing and respiratory function stable Cardiovascular status: blood pressure returned to baseline and stable Postop Assessment: no headache and no apparent nausea or vomiting Anesthetic complications: no Comments: Late entry   No notable events documented.   Last Vitals:  Vitals:   07/01/23 1150 07/01/23 1158  BP: (!) 94/56 104/67  Pulse: 72   Resp: 18   Temp: 36.6 C   SpO2: 97%     Last Pain:  Vitals:   07/02/23 1327  TempSrc:   PainSc: 0-No pain                 Windell Norfolk

## 2023-07-10 ENCOUNTER — Other Ambulatory Visit: Payer: Self-pay | Admitting: Internal Medicine

## 2023-07-10 MED ORDER — DOXYCYCLINE MONOHYDRATE 100 MG PO TABS: 100.0000 mg | ORAL_TABLET | Freq: Two times a day (BID) | ORAL | 0 refills | Status: AC

## 2023-07-10 MED ORDER — BISMUTH 262 MG PO CHEW: 2.0000 | CHEWABLE_TABLET | Freq: Four times a day (QID) | ORAL | 0 refills | Status: AC

## 2023-07-10 MED ORDER — PANTOPRAZOLE SODIUM 40 MG PO TBEC: 40.0000 mg | DELAYED_RELEASE_TABLET | Freq: Two times a day (BID) | ORAL | 11 refills | Status: DC

## 2023-07-10 MED ORDER — METRONIDAZOLE 500 MG PO TABS: 500.0000 mg | ORAL_TABLET | Freq: Three times a day (TID) | ORAL | 0 refills | Status: AC

## 2023-07-12 ENCOUNTER — Encounter (HOSPITAL_COMMUNITY): Payer: Self-pay | Admitting: Internal Medicine

## 2023-09-10 ENCOUNTER — Encounter: Payer: Self-pay | Admitting: Gastroenterology

## 2023-10-18 ENCOUNTER — Telehealth: Payer: Self-pay | Admitting: Podiatry

## 2023-10-18 NOTE — Telephone Encounter (Signed)
 Addysyn Stettner called ....  Sd her daughter Georgette Shell Panal - 38 years old) is having surgery.  She will be sending FMLA paperwork for herself as a caregiver ...   Dr. Logan Bores is the provider.     J. Abbott -- 10/18/2023

## 2023-10-30 NOTE — Telephone Encounter (Signed)
 NA

## 2023-11-14 ENCOUNTER — Telehealth: Payer: Self-pay | Admitting: Podiatry

## 2023-11-14 NOTE — Telephone Encounter (Signed)
Completed Certification of Healthcare Provider form from [employer] for [employer].  Mrs. Hollman is taking time off of work to care for her daughter after surgery Vita Erm) who is 38 years old.  The surgery is 11/21/2023 and her time away from work will be 11/21/2023 thru 12/05/2023.  Faxed form to [phone] -- s/w Mrs. Soughern and advised same .Marland Kitchen...     J. Abbott -- 11/14/2023

## 2023-12-10 ENCOUNTER — Encounter: Payer: Self-pay | Admitting: Gastroenterology

## 2023-12-10 ENCOUNTER — Ambulatory Visit: Payer: BC Managed Care – PPO | Admitting: Gastroenterology

## 2023-12-11 NOTE — Progress Notes (Deleted)
 NEUROLOGY FOLLOW UP OFFICE NOTE  Cheyenne Wolfe [id number]  Assessment/Plan:   Migraine with aura, without status migrainosus, not intractable Juvenile myoclonic epilepsy   Migraine prevention:  Plan to start Aimovig 140mg  every 4 weeks ***; continue topiramate 100mg  daily (which may be controlling seizure disorder as well) Migraine rescue:  Maxalt-MLT 10mg  and Zofran ODT 4mg . *** Limit use of pain relievers to no more than 2 days out of week to prevent risk of rebound or medication-overuse headache. Keep headache diary Follow up 6 months.    Subjective:  Cheyenne Wolfe is a 38 year old right-handed female with Bipolar 1 disorder, anxiety, migraines and history of juvenile myoclonic epilepsy who follows up for migraines.  UPDATE: Plan was to start Emgality but was denied.  Instead, plan was to switch to Aimovig *** Intensity:  *** Duration:  *** with rizatriptan Frequency:  *** Frequency of abortive medication: *** Current NSAIDS/analgesics:  naproxen (sciatica) Current triptans:  rizatriptan 10mg  Current ergotamine:  none Current anti-emetic:  Zofran ODT 4mg  Current muscle relaxants:  none Current Antihypertensive medications:  none Current Antidepressant medications:  amitriptyline 10mg  at bedtime (depression) Current Anticonvulsant medications:  topiramate 100mg  daily Current anti-CGRP:  none Other therapy:  none Birth control:  none   Caffeine:  coffee and energy drinks most days, soda daily Alcohol:  no Smoker:  no Diet:  at least 2-3 bottles of water daily.  Skips meals (no time) Depression:  yes; Anxiety:  yes Sleep hygiene:  4 hours of sleep. Mother of 3.  Works at [employer].  Goes to [employer] for school.    HISTORY: Onset:  19-38 years old, worse over past 2-3 months. Location:  forehead and temples, occasionally diffuse Quality:  throbbing Intensity:  5/10.   Aura:  sees "bubbles" Prodrome:  upset stomach Associated symptoms:  Nausea, vomiting,  photophobia, phonophobia, osmophobia, irritable.  She denies associated unilateral numbness or weakness. Duration:  1-2 days Frequency:  initially 1-2 a month, now 3 to 4 days a week Frequency of abortive medication: ibuprofen or Excedrin Migraine almost daily Triggers:  Stress, smells (cleaning products) Relieving factors:  cold or warm compress on head, sleep Activity:  movement aggravates  Diagnosed with Juvenile Myoclonic Epilepsy at age 66-66 years old.  She would have morning upper body jerking.  She had generalized tonic clonic seizures in 2005 and 2006 (both occurred in setting of off medication).  Treated with topiramate (200mg  daily) but now on 100mg  for past 5 years for migraines.  Subsequent EEGs have been reportedly normal.  Other past medications:  Depakote (weight gain).  Paternal great grandmother had seizures.  Her son is currently being evaluated for absence seizures.  08/08/2022 CT HEAD:  Negative  Past NSAIDS/analgesics:  ibuprofen, Excedrin Migraine,  Past abortive triptans:  sumatriptan tab Past abortive ergotamine:  none Past muscle relaxants:  none Past anti-emetic:  promethazine Past antihypertensive medications:  none Past antidepressant medications:  venlafaxine Past anticonvulsant medications:  lamotrigine Past anti-CGRP:  none    Family history of migraines:  unknown  PAST MEDICAL HISTORY: Past Medical History:  Diagnosis Date   Ankle fracture 04/2017   left   Anxiety    Asthma    Bipolar 1 disorder (HCC)    History of asthma    no current med.   History of seizures    no seizures in > 10 yr. - no current med.   Migraines    Obesity     MEDICATIONS: Current Outpatient Medications on  File Prior to Visit  Medication Sig Dispense Refill   amitriptyline (ELAVIL) 10 MG tablet Take 10 mg by mouth at bedtime.     ibuprofen (ADVIL) 200 MG tablet Take by mouth.     naproxen (NAPROSYN) 500 MG tablet Take 1 tablet (500 mg total) by mouth 2 (two) times  daily. 30 tablet 0   ondansetron (ZOFRAN-ODT) 4 MG disintegrating tablet Take 1 tablet (4 mg total) by mouth every 8 (eight) hours as needed. 20 tablet 5   pantoprazole (PROTONIX) 40 MG tablet Take 1 tablet (40 mg total) by mouth 2 (two) times daily. 60 tablet 11   rizatriptan (MAXALT-MLT) 10 MG disintegrating tablet Take 1 tablet (10 mg total) by mouth as needed for migraine. May repeat in 2 hours if needed.  Maximum 2 tablets in 24 hours. 10 tablet 5   topiramate (TOPAMAX) 50 MG tablet Take 150 mg by mouth daily.     No current facility-administered medications on file prior to visit.    ALLERGIES: Allergies  Allergen Reactions   Oxycodone Nausea And Vomiting    FAMILY HISTORY: Family History  Problem Relation Age of Onset   Hypertension Mother    Diabetes Mother    Colon polyps Mother        possible   Schizophrenia Father    Asthma Brother    Stroke Maternal Grandmother    Dementia Maternal Grandmother    Hypertension Other    Cancer Other    Migraines Son    Seizures Son       Objective:  *** General: No acute distress.  Patient appears ***-groomed.   Head:  Normocephalic/atraumatic Eyes:  Fundi examined but not visualized Neck: supple, no paraspinal tenderness, full range of motion Heart:  Regular rate and rhythm Lungs:  Clear to auscultation bilaterally Back: No paraspinal tenderness Neurological Exam: alert and oriented.  Speech fluent and not dysarthric, language intact.  CN II-XII intact. Bulk and tone normal, muscle strength 5/5 throughout.  Sensation to light touch intact.  Deep tendon reflexes 2+ throughout, toes downgoing.  Finger to nose testing intact.  Gait normal, Romberg negative.   Cheyenne Millet, DO  CC: ***

## 2023-12-13 ENCOUNTER — Ambulatory Visit: Payer: BC Managed Care – PPO | Admitting: Neurology

## 2024-03-03 NOTE — Progress Notes (Deleted)
 NEUROLOGY FOLLOW UP OFFICE NOTE  Cheyenne Wolfe [id number]  Assessment/Plan:   Migraine with aura, without status migrainosus, not intractable Juvenile myoclonic epilepsy   Migraine prevention:  Plan to start Emgality ; continue topiramate 100mg  daily (which may be controlling seizure disorder as well) *** Migraine rescue:  Maxalt -MLT 10mg  and Zofran  ODT 4mg .  *** Limit use of pain relievers to no more than 2 days out of week to prevent risk of rebound or medication-overuse headache. Keep headache diary Follow up 6 months.    Subjective:  Cheyenne Wolfe is a 38 year old right-handed female with Bipolar 1 disorder and anxiety who follows up for migraines and juvenile myoclonic epilepsy.  UPDATE: Migraines: Plan was to start Emgality  which was denied by her insurance.  We were going to start Aimovig but patient did not return our call.  ***  Intensity:  *** Duration:  *** with Maxalt  Frequency:  *** Frequency of abortive medication: *** Current NSAIDS/analgesics:  ibuprofen, Excedrin Migraine ***, naproxen  (sciatica) Current triptans:  Maxalt -MLT 10mg  Current ergotamine:  none Current anti-emetic:  Zofran  ODT 4mg  Current muscle relaxants:  none Current Antihypertensive medications:  none Current Antidepressant medications:  amitriptyline 10mg  at bedtime (depression) Current Anticonvulsant medications:  topiramate 100mg  daily (likely controlling JME as well) Current anti-CGRP:  none Other therapy:  none Birth control:  none   Caffeine:  coffee and energy drinks most days, soda daily Alcohol:  no Smoker:  no Diet:  at least 2-3 bottles of water daily.  Skips meals (no time) Depression:  yes; Anxiety:  yes Sleep hygiene:  4 hours of sleep. Mother of 3.  Works at [employer].  Goes to [employer] for school.   HISTORY: Migraines: Onset:  37-1 years old, worse over past 2-3 months. Location:  forehead and temples, occasionally diffuse Quality:  throbbing Intensity:  5/10.    Aura:  sees "bubbles" Prodrome:  upset stomach Associated symptoms:  Nausea, vomiting, photophobia, phonophobia, osmophobia, irritable.  She denies associated unilateral numbness or weakness. Duration:  1-2 days Frequency:  initially 1-2 a month, now 3 to 4 days a week Frequency of abortive medication: ibuprofen or Excedrin Migraine almost daily Triggers:  Stress, smells (cleaning products) Relieving factors:  cold or warm compress on head, sleep Activity:  movement aggravates  Juvenile Myoclonic Epilepsy: Diagnosed with Juvenile Myoclonic Epilepsy at age 9-14 years old.  She would have morning upper body jerking.  She had generalized tonic clonic seizures in 2005 and 2006 (both occurred in setting of off medication).  Treated with topiramate (200mg  daily) but now on 100mg  for past 5 years for migraines.  Subsequent EEGs have been reportedly normal.  Other past medications:  Depakote (weight gain).  Paternal great grandmother had seizures.  Her son is currently being evaluated for absence seizures. ***  08/08/2022 CT HEAD:  Negative  Past medications: Past NSAIDS/analgesics:  ibuprofen, Excedrin *** Past abortive triptans:  sumatriptan tab Past abortive ergotamine:  none Past muscle relaxants:  none Past anti-emetic:  promethazine  Past antihypertensive medications:  none Past antidepressant medications:  venlafaxine Past anticonvulsant medications:  lamotrigine Past anti-CGRP:  none     Family history of migraines:  unknown  PAST MEDICAL HISTORY: Past Medical History:  Diagnosis Date   Ankle fracture 04/2017   left   Anxiety    Asthma    Bipolar 1 disorder (HCC)    History of asthma    no current med.   History of seizures    no seizures in >  10 yr. - no current med.   Migraines    Obesity     MEDICATIONS: Current Outpatient Medications on File Prior to Visit  Medication Sig Dispense Refill   amitriptyline (ELAVIL) 10 MG tablet Take 10 mg by mouth at bedtime.      ibuprofen (ADVIL) 200 MG tablet Take by mouth.     naproxen  (NAPROSYN ) 500 MG tablet Take 1 tablet (500 mg total) by mouth 2 (two) times daily. 30 tablet 0   ondansetron  (ZOFRAN -ODT) 4 MG disintegrating tablet Take 1 tablet (4 mg total) by mouth every 8 (eight) hours as needed. 20 tablet 5   pantoprazole  (PROTONIX ) 40 MG tablet Take 1 tablet (40 mg total) by mouth 2 (two) times daily. 60 tablet 11   rizatriptan  (MAXALT -MLT) 10 MG disintegrating tablet Take 1 tablet (10 mg total) by mouth as needed for migraine. May repeat in 2 hours if needed.  Maximum 2 tablets in 24 hours. 10 tablet 5   topiramate (TOPAMAX) 50 MG tablet Take 150 mg by mouth daily.     No current facility-administered medications on file prior to visit.    ALLERGIES: Allergies  Allergen Reactions   Oxycodone  Nausea And Vomiting    FAMILY HISTORY: Family History  Problem Relation Age of Onset   Hypertension Mother    Diabetes Mother    Colon polyps Mother        possible   Schizophrenia Father    Asthma Brother    Stroke Maternal Grandmother    Dementia Maternal Grandmother    Hypertension Other    Cancer Other    Migraines Son    Seizures Son       Objective:  *** General: No acute distress.  Patient appears ***-groomed.   Head:  Normocephalic/atraumatic Eyes:  Fundi examined but not visualized Neck: supple, no paraspinal tenderness, full range of motion Heart:  Regular rate and rhythm Lungs:  Clear to auscultation bilaterally Back: No paraspinal tenderness Neurological Exam: alert and oriented.  Speech fluent and not dysarthric, language intact.  CN II-XII intact. Bulk and tone normal, muscle strength 5/5 throughout.  Sensation to light touch intact.  Deep tendon reflexes 2+ throughout, toes downgoing.  Finger to nose testing intact.  Gait normal, Romberg negative.   Janne Members, DO  CC: ***

## 2024-03-04 ENCOUNTER — Ambulatory Visit: Admitting: Neurology

## 2024-03-04 NOTE — Progress Notes (Signed)
 NEUROLOGY FOLLOW UP OFFICE NOTE  Cheyenne Wolfe [id number]  Assessment/Plan:   Migraine with aura, without status migrainosus, not intractable Juvenile myoclonic epilepsy   Migraine prevention:  Plan to start Aimovig 140mg ; continue topiramate 150mg  daily (which may be controlling seizure disorder as well) Migraine rescue:  Stop Maxalt -MLT 10mg .  She will try samples of Ubrelvy 100mg ; Zofran  ODT 4mg .   Limit use of pain relievers to no more than 9 days out of the month to prevent risk of rebound or medication-overuse headache. Keep headache diary Follow up 6 months.    Subjective:  Cheyenne Wolfe is a 38 year old right-handed female with Bipolar 1 disorder and anxiety who follows up for migraines and juvenile myoclonic epilepsy.  UPDATE: Migraines: Plan was to start Emgality  which was denied by her insurance.  We were going to start Aimovig but patient did not return our call.   Identified some triggers:  heat, stress, direct sunlight Modified diet.  Avoids chocolate. Intensity:  5/10 Duration:  30 minutes with naproxen , Maxalt  not effective Frequency:  2 to 3 times a week Frequency of abortive medication: naproxen  once a week Current NSAIDS/analgesics:  naproxen  Current triptans:  Maxalt -MLT 10mg  Current ergotamine:  none Current anti-emetic:  Zofran  ODT 4mg  Current muscle relaxants:  none Current Antihypertensive medications:  none Current Antidepressant medications:  amitriptyline 10mg  at bedtime (depression) Current Anticonvulsant medications:  topiramate 150mg  daily (likely controlling JME as well) Current anti-CGRP:  none Other therapy:  none Birth control:  none   Caffeine:  cut down energy drinks to 1 every 2 days and coffee once a week.  2 sodas daily Alcohol:  no Smoker:  no Diet:  at least 2-3 bottles of water daily.  Depression:  yes; Anxiety:  yes Sleep hygiene:  4 hours of sleep. Mother of 3.  Works at [employer].  Goes to [employer] for school.    HISTORY: Migraines: Onset:  79-60 years old, worse over past 2-3 months. Location:  forehead and temples, occasionally diffuse Quality:  throbbing Intensity:  5/10.   Aura:  sees "bubbles" Prodrome:  upset stomach Associated symptoms:  Nausea, vomiting, photophobia, phonophobia, osmophobia, irritable.  She denies associated unilateral numbness or weakness. Duration:  1-2 days Frequency:  initially 1-2 a month, now 3 to 4 days a week Frequency of abortive medication: ibuprofen or Excedrin Migraine almost daily Triggers:  Stress, smells (cleaning products) Relieving factors:  cold or warm compress on head, sleep Activity:  movement aggravates  Juvenile Myoclonic Epilepsy: Diagnosed with Juvenile Myoclonic Epilepsy at age 38 years old.  She would have morning upper body jerking.  She had generalized tonic clonic seizures in 2005 and 2006 (both occurred in setting of off medication).  Treated with topiramate (200mg  daily) and later down to 100mg  for past for migraines.  Subsequent EEGs have been reportedly normal.  Other past medications:  Depakote (weight gain).  Paternal great grandmother had seizures.  Her son has absence seizures  08/08/2022 CT HEAD:  Negative  Past medications: Past NSAIDS/analgesics:  ibuprofen, Excedrin, Tylenol  Past abortive triptans:  sumatriptan tab Past abortive ergotamine:  none Past muscle relaxants:  none Past anti-emetic:  promethazine  Past antihypertensive medications:  none Past antidepressant medications:  venlafaxine Past anticonvulsant medications:  lamotrigine Past anti-CGRP:  none     Family history of migraines:  unknown  PAST MEDICAL HISTORY: Past Medical History:  Diagnosis Date   Ankle fracture 04/2017   left   Anxiety    Asthma  Bipolar 1 disorder (HCC)    History of asthma    no current med.   History of seizures    no seizures in > 10 yr. - no current med.   Migraines    Obesity     MEDICATIONS: Current Outpatient  Medications on File Prior to Visit  Medication Sig Dispense Refill   amitriptyline (ELAVIL) 10 MG tablet Take 10 mg by mouth at bedtime.     ibuprofen (ADVIL) 200 MG tablet Take by mouth.     naproxen  (NAPROSYN ) 500 MG tablet Take 1 tablet (500 mg total) by mouth 2 (two) times daily. 30 tablet 0   ondansetron  (ZOFRAN -ODT) 4 MG disintegrating tablet Take 1 tablet (4 mg total) by mouth every 8 (eight) hours as needed. 20 tablet 5   pantoprazole  (PROTONIX ) 40 MG tablet Take 1 tablet (40 mg total) by mouth 2 (two) times daily. 60 tablet 11   rizatriptan  (MAXALT -MLT) 10 MG disintegrating tablet Take 1 tablet (10 mg total) by mouth as needed for migraine. May repeat in 2 hours if needed.  Maximum 2 tablets in 24 hours. 10 tablet 5   topiramate (TOPAMAX) 50 MG tablet Take 150 mg by mouth daily.     No current facility-administered medications on file prior to visit.    ALLERGIES: Allergies  Allergen Reactions   Oxycodone  Nausea And Vomiting    FAMILY HISTORY: Family History  Problem Relation Age of Onset   Hypertension Mother    Diabetes Mother    Colon polyps Mother        possible   Schizophrenia Father    Asthma Brother    Stroke Maternal Grandmother    Dementia Maternal Grandmother    Hypertension Other    Cancer Other    Migraines Son    Seizures Son       Objective:  Blood pressure 109/74, pulse 80, height 5\' 3"  (1.6 m), weight 296 lb 3.2 oz (134.4 kg), SpO2 99%. General: No acute distress.  Patient appears well-groomed.   Head:  Normocephalic/atraumatic Eyes:  Fundi examined but not visualized Neck: supple, no paraspinal tenderness, full range of motion Heart:  Regular rate and rhythm Lungs:  Clear to auscultation bilaterally Back: No paraspinal tenderness Neurological Exam: alert and oriented.  Speech fluent and not dysarthric, language intact.  CN II-XII intact. Bulk and tone normal, muscle strength 5/5 throughout.  Sensation to light touch intact.  Deep tendon  reflexes 2+ throughout, toes downgoing.  Finger to nose testing intact.  Gait normal, Romberg negative.   Janne Members, DO  CC: Francene Ing, FNP

## 2024-03-06 ENCOUNTER — Telehealth: Payer: Self-pay

## 2024-03-06 ENCOUNTER — Ambulatory Visit (INDEPENDENT_AMBULATORY_CARE_PROVIDER_SITE_OTHER): Admitting: Neurology

## 2024-03-06 ENCOUNTER — Encounter: Payer: Self-pay | Admitting: Neurology

## 2024-03-06 VITALS — BP 109/74 | HR 80 | Ht 63.0 in | Wt 296.2 lb

## 2024-03-06 DIAGNOSIS — G43009 Migraine without aura, not intractable, without status migrainosus: Secondary | ICD-10-CM

## 2024-03-06 DIAGNOSIS — G40B09 Juvenile myoclonic epilepsy, not intractable, without status epilepticus: Secondary | ICD-10-CM

## 2024-03-06 MED ORDER — AIMOVIG 140 MG/ML ~~LOC~~ SOAJ: 140.0000 mg | SUBCUTANEOUS | 11 refills | Status: DC

## 2024-03-06 NOTE — Patient Instructions (Signed)
 Start Aimovig 140mg  injection every 28 days Continue topiramate 150mg  daily Stop rizatriptan .  Take Ubrelvy at earliest onset of headache.  May repeat after 2 hours.  Maximum 2 tablets in 24 hour. Ondansetron  for nausea May use naproxen  but limit use of pain relievers to no more than 9 days out of the month to prevent risk of rebound or medication-overuse headache. Keep headache diary

## 2024-03-06 NOTE — Telephone Encounter (Signed)
 PA needed for Aimovig.

## 2024-03-11 ENCOUNTER — Telehealth: Payer: Self-pay | Admitting: Neurology

## 2024-03-11 ENCOUNTER — Other Ambulatory Visit (HOSPITAL_COMMUNITY): Payer: Self-pay

## 2024-03-11 ENCOUNTER — Telehealth: Payer: Self-pay

## 2024-03-11 MED ORDER — TOPIRAMATE 50 MG PO TABS: 150.0000 mg | ORAL_TABLET | Freq: Every day | ORAL | 5 refills | Status: DC

## 2024-03-11 NOTE — Telephone Encounter (Signed)
*  [facility]  Pharmacy Patient Advocate Encounter   Received notification from Pt Calls Messages that prior authorization for Aimovig 140MG /ML auto-injectors  is required/requested.   Insurance verification completed.   The patient is insured through [facility] .   Per test claim: PA required; PA submitted to above mentioned insurance via CoverMyMeds Key/confirmation #/EOC [id number] Status is pending

## 2024-03-11 NOTE — Telephone Encounter (Signed)
 PA team, PA status.  DR.Jaffe can I refill the Topiramate?

## 2024-03-11 NOTE — Telephone Encounter (Signed)
 Pt. Would update on Aimovig PA and needs TOPAMAX 150MG  Rx sent into pharmacy as they are missing the refill

## 2024-03-11 NOTE — Telephone Encounter (Signed)
 Approved today by [employer] 2017 NCPDP Request Reference Number: [id number]. AIMOVIG INJ 140MG /ML is approved through 09/11/2024. Your patient may now fill this prescription and it will be covered. Effective Date: 03/11/2024 Authorization Expiration Date: 09/11/2024  Unable to do test claim as patient must go through [facility].

## 2024-03-11 NOTE — Telephone Encounter (Signed)
 PA request has been Submitted. New Encounter has been or will be created for follow up. For additional info see Pharmacy Prior Auth telephone encounter from 05/28.

## 2024-05-14 ENCOUNTER — Other Ambulatory Visit: Payer: Self-pay | Admitting: Medical Genetics

## 2024-05-15 ENCOUNTER — Other Ambulatory Visit (HOSPITAL_COMMUNITY)
Admission: RE | Admit: 2024-05-15 | Discharge: 2024-05-15 | Disposition: A | Discharge status: Home / Self Care | Payer: Self-pay | Source: Ambulatory Visit | Attending: Medical Genetics | Admitting: Medical Genetics

## 2024-05-25 LAB — GENECONNECT MOLECULAR SCREEN: Genetic Analysis Overall Interpretation: NEGATIVE

## 2024-09-21 ENCOUNTER — Ambulatory Visit: Admitting: Neurology

## 2024-09-28 ENCOUNTER — Ambulatory Visit: Admitting: Neurology

## 2024-10-02 ENCOUNTER — Other Ambulatory Visit (HOSPITAL_COMMUNITY): Payer: Self-pay

## 2024-10-02 ENCOUNTER — Telehealth: Payer: Self-pay | Admitting: Pharmacy Technician

## 2024-10-02 NOTE — Telephone Encounter (Signed)
 Pharmacy Patient Advocate Encounter   Received notification from CoverMyMeds that prior authorization for AIMOVIG  140MG  is required/requested.   Insurance verification completed.   The patient is insured through [facility].   Per test claim: PA required; PA submitted to above mentioned insurance via Latent Key/confirmation #/EOC [id number] Status is pending

## 2024-10-02 NOTE — Telephone Encounter (Signed)
 Pharmacy Patient Advocate Encounter  Received notification from [employer] that Prior Authorization for AIMOVIG  140MG  has been APPROVED from 12.19.25 to 12.19.27   PA #/Case ID/Reference #: [id number]

## 2024-10-20 NOTE — Progress Notes (Unsigned)
 "  NEUROLOGY FOLLOW UP OFFICE NOTE  Cheyenne Wolfe [id number]  Assessment/Plan:   Migraine with aura, without status migrainosus, not intractable Juvenile myoclonic epilepsy   Migraine prevention:  Aimovig  140mg ; continue topiramate  150mg  daily (which may be controlling seizure disorder as well) Migraine rescue:  Stop Maxalt -MLT 10mg .  She will try samples of Ubrelvy  100mg ; Zofran  ODT 4mg .  *** Limit use of pain relievers to no more than 9 days out of the month to prevent risk of rebound or medication-overuse headache. Keep headache diary Follow up 6 months.    Subjective:  Cheyenne Wolfe is a 39 year old right-handed female with Bipolar 1 disorder and anxiety who follows up for migraines and juvenile myoclonic epilepsy.  UPDATE: Migraines: Started Aimovig . *** Intensity:  5/10 Duration:  30 minutes with naproxen .  Ubrelvy  samples?  *** Frequency:  2 to 3 times a week Frequency of abortive medication: naproxen  once a week Current NSAIDS/analgesics:  naproxen  Current triptans:  Maxalt -MLT 10mg  Current ergotamine:  none Current anti-emetic:  Zofran  ODT 4mg  Current muscle relaxants:  none Current Antihypertensive medications:  none Current Antidepressant medications:  amitriptyline 10mg  at bedtime (depression) Current Anticonvulsant medications:  topiramate  150mg  daily (likely controlling JME as well) Current anti-CGRP:  none Other therapy:  none Birth control:  none   Caffeine:  cut down energy drinks to 1 every 2 days and coffee once a week.  2 sodas daily Alcohol:  no Smoker:  no Diet:  at least 2-3 bottles of water daily. Modified diet.  Avoids chocolate. Depression:  yes; Anxiety:  yes Sleep hygiene:  4 hours of sleep. Mother of 3.  Works at [employer].  Goes to [employer] for school.   HISTORY: Migraines: Onset:  25-18 years old, worse over past 2-3 months. Location:  forehead and temples, occasionally diffuse Quality:  throbbing Intensity:  5/10.   Aura:  sees  bubbles Prodrome:  upset stomach Associated symptoms:  Nausea, vomiting, photophobia, phonophobia, osmophobia, irritable.  She denies associated unilateral numbness or weakness. Duration:  1-2 days Frequency:  initially 1-2 a month, now 3 to 4 days a week Frequency of abortive medication: ibuprofen or Excedrin Migraine almost daily Triggers:  Stress, smells (cleaning products) Relieving factors:  cold or warm compress on head, sleep Activity:  movement aggravates  Juvenile Myoclonic Epilepsy: Diagnosed with Juvenile Myoclonic Epilepsy at age 70-50 years old.  She would have morning upper body jerking.  She had generalized tonic clonic seizures in 2005 and 2006 (both occurred in setting of off medication).  Treated with topiramate  (200mg  daily) and later down to 100mg  for past for migraines.  Subsequent EEGs have been reportedly normal.  Other past medications:  Depakote (weight gain).  Paternal great grandmother had seizures.  Her son has absence seizures  08/08/2022 CT HEAD:  Negative  Past medications: Past NSAIDS/analgesics:  ibuprofen, Excedrin, Tylenol  Past abortive triptans:  sumatriptan tab, rizatriptan  10mg  Past abortive ergotamine:  none Past muscle relaxants:  none Past anti-emetic:  promethazine  Past antihypertensive medications:  none Past antidepressant medications:  venlafaxine Past anticonvulsant medications:  lamotrigine Past anti-CGRP:  none     Family history of migraines:  unknown  PAST MEDICAL HISTORY: Past Medical History:  Diagnosis Date   Ankle fracture 04/2017   left   Anxiety    Asthma    Bipolar 1 disorder (HCC)    History of asthma    no current med.   History of seizures    no seizures in > 10 yr. -  no current med.   Migraines    Obesity     MEDICATIONS: Current Outpatient Medications on File Prior to Visit  Medication Sig Dispense Refill   amitriptyline (ELAVIL) 10 MG tablet Take 10 mg by mouth at bedtime.     Erenumab -aooe (AIMOVIG )  140 MG/ML SOAJ Inject 140 mg into the skin every 28 (twenty-eight) days. 1.12 mL 11   ibuprofen (ADVIL) 200 MG tablet Take by mouth.     naproxen  (NAPROSYN ) 500 MG tablet Take 1 tablet (500 mg total) by mouth 2 (two) times daily. 30 tablet 0   ondansetron  (ZOFRAN -ODT) 4 MG disintegrating tablet Take 1 tablet (4 mg total) by mouth every 8 (eight) hours as needed. 20 tablet 5   pantoprazole  (PROTONIX ) 40 MG tablet Take 1 tablet (40 mg total) by mouth 2 (two) times daily. 60 tablet 11   topiramate  (TOPAMAX ) 50 MG tablet Take 3 tablets (150 mg total) by mouth daily. 90 tablet 5   No current facility-administered medications on file prior to visit.    ALLERGIES: Allergies  Allergen Reactions   Oxycodone  Nausea And Vomiting    FAMILY HISTORY: Family History  Problem Relation Age of Onset   Hypertension Mother    Diabetes Mother    Colon polyps Mother        possible   Schizophrenia Father    Asthma Brother    Stroke Maternal Grandmother    Dementia Maternal Grandmother    Hypertension Other    Cancer Other    Migraines Son    Seizures Son       Objective:  *** General: No acute distress.  Patient appears well-groomed.   ***   Juliene Dunnings, DO  CC: Tillman Collet, FNP ***       "

## 2024-10-21 ENCOUNTER — Encounter: Payer: Self-pay | Admitting: Neurology

## 2024-10-21 ENCOUNTER — Ambulatory Visit (INDEPENDENT_AMBULATORY_CARE_PROVIDER_SITE_OTHER): Admitting: Neurology

## 2024-10-21 VITALS — BP 126/83 | HR 72 | Ht 63.0 in | Wt 287.4 lb

## 2024-10-21 DIAGNOSIS — G43009 Migraine without aura, not intractable, without status migrainosus: Secondary | ICD-10-CM

## 2024-10-21 DIAGNOSIS — G40B09 Juvenile myoclonic epilepsy, not intractable, without status epilepticus: Secondary | ICD-10-CM

## 2024-10-21 MED ORDER — TOPIRAMATE 50 MG PO TABS: 150.0000 mg | ORAL_TABLET | Freq: Two times a day (BID) | ORAL | 3 refills | Status: AC

## 2024-10-21 MED ORDER — UBRELVY 100 MG PO TABS: 1.0000 | ORAL_TABLET | ORAL | 11 refills | Status: AC | PRN

## 2024-10-21 MED ORDER — AIMOVIG 140 MG/ML ~~LOC~~ SOAJ: 140.0000 mg | SUBCUTANEOUS | 11 refills | Status: AC

## 2024-10-21 MED ORDER — ONDANSETRON 4 MG PO TBDP: 4.0000 mg | ORAL_TABLET | Freq: Three times a day (TID) | ORAL | 5 refills | Status: AC | PRN

## 2024-10-21 NOTE — Patient Instructions (Signed)
 Restart topiramate  50mg  tablet: Take 1 tablet daily for one week Then 1 tablet twice daily for one week Then 2 tablets twice daily for one week Then 3 tablets twice daily Continue Aimovig  every 4 weeks Ubrelvy  at earliest onset of migraine.  May repeat in 2 hours. Maximum 2 tablets in 24 hours. Follow up 6 months.

## 2025-04-21 ENCOUNTER — Ambulatory Visit: Payer: Self-pay | Admitting: Neurology
